# Patient Record
Sex: Male | Born: 1955 | Race: Black or African American | Hispanic: No | Marital: Single | Smoking: Never smoker
Health system: Southern US, Community
[De-identification: ages and names within clinical notes are randomized; demographics above are authoritative.]

## PROBLEM LIST (undated history)

## (undated) DIAGNOSIS — E7800 Pure hypercholesterolemia, unspecified: Secondary | ICD-10-CM

## (undated) DIAGNOSIS — E119 Type 2 diabetes mellitus without complications: Secondary | ICD-10-CM

## (undated) DIAGNOSIS — M1990 Unspecified osteoarthritis, unspecified site: Secondary | ICD-10-CM

## (undated) DIAGNOSIS — I10 Essential (primary) hypertension: Secondary | ICD-10-CM

## (undated) DIAGNOSIS — I4891 Unspecified atrial fibrillation: Secondary | ICD-10-CM

## (undated) DIAGNOSIS — Z87442 Personal history of urinary calculi: Secondary | ICD-10-CM

---

## 1991-06-13 DIAGNOSIS — I4891 Unspecified atrial fibrillation: Secondary | ICD-10-CM

## 1991-06-13 HISTORY — DX: Unspecified atrial fibrillation: I48.91

## 2006-02-01 ENCOUNTER — Emergency Department: Payer: Self-pay | Admitting: Internal Medicine

## 2006-02-12 ENCOUNTER — Emergency Department: Payer: Self-pay | Admitting: Emergency Medicine

## 2007-03-07 ENCOUNTER — Observation Stay: Payer: Self-pay | Admitting: Internal Medicine

## 2007-03-07 ENCOUNTER — Other Ambulatory Visit: Payer: Self-pay

## 2007-09-24 ENCOUNTER — Inpatient Hospital Stay: Payer: Self-pay | Admitting: *Deleted

## 2007-09-24 ENCOUNTER — Other Ambulatory Visit: Payer: Self-pay

## 2007-09-30 ENCOUNTER — Ambulatory Visit: Payer: Self-pay | Admitting: Internal Medicine

## 2008-02-12 ENCOUNTER — Emergency Department: Payer: Self-pay | Admitting: Emergency Medicine

## 2008-04-19 ENCOUNTER — Emergency Department: Payer: Self-pay | Admitting: Emergency Medicine
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
## 2009-12-19 ENCOUNTER — Emergency Department: Payer: Self-pay | Admitting: Internal Medicine

## 2010-04-23 ENCOUNTER — Observation Stay: Payer: Self-pay | Admitting: Internal Medicine

## 2010-05-04 ENCOUNTER — Ambulatory Visit: Payer: Self-pay | Admitting: Internal Medicine

## 2013-04-21 ENCOUNTER — Emergency Department: Payer: Self-pay | Admitting: Emergency Medicine

## 2013-06-26 ENCOUNTER — Emergency Department: Payer: Self-pay | Admitting: Emergency Medicine

## 2014-10-21 ENCOUNTER — Ambulatory Visit
Admission: RE | Admit: 2014-10-21 | Discharge: 2014-10-21 | Disposition: A | Discharge status: Home / Self Care | Payer: No Typology Code available for payment source | Source: Ambulatory Visit | Attending: Nurse Practitioner | Admitting: Nurse Practitioner

## 2014-10-21 ENCOUNTER — Other Ambulatory Visit: Payer: Self-pay | Admitting: Nurse Practitioner

## 2014-10-21 DIAGNOSIS — M25851 Other specified joint disorders, right hip: Secondary | ICD-10-CM | POA: Insufficient documentation

## 2014-10-21 DIAGNOSIS — M160 Bilateral primary osteoarthritis of hip: Secondary | ICD-10-CM | POA: Diagnosis not present

## 2014-10-21 DIAGNOSIS — M545 Low back pain: Secondary | ICD-10-CM | POA: Diagnosis present

## 2014-10-21 DIAGNOSIS — M5387 Other specified dorsopathies, lumbosacral region: Secondary | ICD-10-CM | POA: Insufficient documentation

## 2014-10-21 DIAGNOSIS — M25551 Pain in right hip: Secondary | ICD-10-CM | POA: Diagnosis not present

## 2014-10-21 DIAGNOSIS — M47897 Other spondylosis, lumbosacral region: Secondary | ICD-10-CM | POA: Insufficient documentation

## 2014-10-21 DIAGNOSIS — M25852 Other specified joint disorders, left hip: Secondary | ICD-10-CM | POA: Diagnosis not present

## 2014-11-25 ENCOUNTER — Other Ambulatory Visit: Payer: Self-pay | Admitting: Internal Medicine

## 2014-11-25 DIAGNOSIS — R0602 Shortness of breath: Secondary | ICD-10-CM

## 2014-11-25 DIAGNOSIS — R079 Chest pain, unspecified: Secondary | ICD-10-CM

## 2014-11-27 ENCOUNTER — Encounter: Admission: RE | Admit: 2014-11-27 | Payer: No Typology Code available for payment source | Source: Ambulatory Visit

## 2015-04-09 ENCOUNTER — Emergency Department: Payer: BLUE CROSS/BLUE SHIELD

## 2015-04-09 ENCOUNTER — Emergency Department
Admission: EM | Admit: 2015-04-09 | Discharge: 2015-04-09 | Disposition: A | Discharge status: Home / Self Care | Payer: BLUE CROSS/BLUE SHIELD | Attending: Emergency Medicine | Admitting: Emergency Medicine

## 2015-04-09 DIAGNOSIS — G510 Bell's palsy: Secondary | ICD-10-CM

## 2015-04-09 DIAGNOSIS — E119 Type 2 diabetes mellitus without complications: Secondary | ICD-10-CM | POA: Insufficient documentation

## 2015-04-09 DIAGNOSIS — R29810 Facial weakness: Secondary | ICD-10-CM | POA: Diagnosis present

## 2015-04-09 LAB — COMPREHENSIVE METABOLIC PANEL
ALBUMIN: 4.3 g/dL (ref 3.5–5.0)
ALK PHOS: 66 U/L (ref 38–126)
ALT: 22 U/L (ref 17–63)
ANION GAP: 6 (ref 5–15)
AST: 21 U/L (ref 15–41)
BUN: 14 mg/dL (ref 6–20)
CALCIUM: 9.4 mg/dL (ref 8.9–10.3)
CHLORIDE: 105 mmol/L (ref 101–111)
CO2: 24 mmol/L (ref 22–32)
Creatinine, Ser: 0.74 mg/dL (ref 0.61–1.24)
GFR calc non Af Amer: >60 mL/min (ref >60)
GLUCOSE: 161 mg/dL — AB (ref 65–99)
Potassium: 3.8 mmol/L (ref 3.5–5.1)
SODIUM: 135 mmol/L (ref 135–145)
Total Bilirubin: 0.9 mg/dL (ref 0.3–1.2)
Total Protein: 8.3 g/dL — ABNORMAL HIGH (ref 6.5–8.1)

## 2015-04-09 LAB — DIFFERENTIAL
BASOS PCT: 1 %
Basophils Absolute: 0 10*3/uL (ref 0–0.1)
Eosinophils Absolute: 0.1 10*3/uL (ref 0–0.7)
Eosinophils Relative: 1 %
LYMPHS PCT: 29 %
Lymphs Abs: 1.7 10*3/uL (ref 1.0–3.6)
Monocytes Absolute: 0.4 10*3/uL (ref 0.2–1.0)
Monocytes Relative: 7 %
NEUTROS ABS: 3.6 10*3/uL (ref 1.4–6.5)
NEUTROS PCT: 62 %

## 2015-04-09 LAB — CBC
HCT: 43.5 % (ref 40.0–52.0)
Hemoglobin: 14.5 g/dL (ref 13.0–18.0)
MCH: 27.7 pg (ref 26.0–34.0)
MCHC: 33.4 g/dL (ref 32.0–36.0)
MCV: 83 fL (ref 80.0–100.0)
PLATELETS: 260 10*3/uL (ref 150–440)
RBC: 5.24 MIL/uL (ref 4.40–5.90)
RDW: 14.2 % (ref 11.5–14.5)
WBC: 5.8 10*3/uL (ref 3.8–10.6)

## 2015-04-09 LAB — GLUCOSE, CAPILLARY: Glucose-Capillary: 134 mg/dL — ABNORMAL HIGH (ref 65–99)

## 2015-04-09 LAB — PROTIME-INR
INR: 0.99
PROTHROMBIN TIME: 13.3 s (ref 11.4–15.0)

## 2015-04-09 LAB — APTT: aPTT: 29 seconds (ref 24–36)

## 2015-04-09 MED ORDER — PREDNISONE 20 MG PO TABS
60.0000 mg | ORAL_TABLET | Freq: Once | ORAL | Status: AC
  Administered 2015-04-09: 60 mg via ORAL
  Filled 2015-04-09: qty 3

## 2015-04-09 MED ORDER — FLUORESCEIN SODIUM 1 MG OP STRP
ORAL_STRIP | OPHTHALMIC | Status: AC
  Administered 2015-04-09: 13:00:00
  Filled 2015-04-09: qty 1

## 2015-04-09 MED ORDER — ASPIRIN 81 MG PO CHEW
324.0000 mg | CHEWABLE_TABLET | Freq: Once | ORAL | Status: AC
  Administered 2015-04-09: 324 mg via ORAL
  Filled 2015-04-09: qty 4

## 2015-04-09 MED ORDER — LORAZEPAM 2 MG/ML IJ SOLN
INTRAMUSCULAR | Status: AC
  Filled 2015-04-09: qty 2

## 2015-04-09 MED ORDER — GADOBENATE DIMEGLUMINE 529 MG/ML IV SOLN
20.0000 mL | Freq: Once | INTRAVENOUS | Status: AC | PRN
  Administered 2015-04-09: 20 mL via INTRAVENOUS

## 2015-04-09 NOTE — Discharge Instructions (Signed)
Bell Palsy Bell palsy is a condition in which the muscles on one side of the face become paralyzed. This often causes one side of the face to droop. It is a common condition and most people recover completely. RISK FACTORS Risk factors for Bell palsy include:  Pregnancy.  Diabetes.  An infection by a virus, such as infections that cause cold sores. CAUSES  Bell palsy is caused by damage to or inflammation of a nerve in your face. It is unclear why this happens, but an infection by a virus may lead to it. Most of the time the reason it happens is unknown. SIGNS AND SYMPTOMS  Symptoms can range from mild to severe and can take place over a number of hours. Symptoms may include:  Being unable to:  Raise one or both eyebrows.  Close one or both eyes.  Feel parts of your face (facial numbness).  Drooping of the eyelid and corner of the mouth.  Weakness in the face.  Paralysis of half your face.  Loss of taste.  Sensitivity to loud noises.  Difficulty chewing.  Tearing up of the affected eye.  Dryness in the affected eye.  Drooling.  Pain behind one ear. DIAGNOSIS  Diagnosis of Bell palsy may include:  A medical history and physical exam.  An MRI.  A CT scan.  Electromyography (EMG). This is a test that checks how your nerves are working. TREATMENT  Treatment may include antiviral medicine to help shorten the length of the condition. Sometimes treatment is not needed and the symptoms go away on their own. HOME CARE INSTRUCTIONS   Take medicines only as directed by your health care provider.  Do facial massages and exercises as directed by your health care provider.  If your eye is affected:  Use moisturizing eye drops to prevent drying of your eye as directed by your health care provider.  Protect your eye as directed by your health care provider. SEEK MEDICAL CARE IF:  Your symptoms do not get better or get worse.  You are drooling.  Your eye is red,  irritated, or hurts. SEEK IMMEDIATE MEDICAL CARE IF:   Another part of your body feels weak or numb.  You have difficulty swallowing.  You have a fever along with symptoms of Bell palsy.  You develop neck pain. MAKE SURE YOU:   Understand these instructions.  Will watch your condition.  Will get help right away if you are not doing well or get worse.   This information is not intended to replace advice given to you by your health care provider. Make sure you discuss any questions you have with your health care provider.   Document Released: 05/29/2005 Document Revised: 02/17/2015 Document Reviewed: 09/05/2013 Elsevier Interactive Patient Education [employer].  Please take one aspirin once a day. Start tomorrow afternoon. Please take the prednisone as directed. Take the Valtrex one pill 3 times a day. Please use the eyedrops every 2-4 hours during the day and asked the pharmacist for help getting some mineral based eyedrops to put in the eye at night. He can also take your eyelid shut as I demonstrated to you using paper tape at night. Please follow-up with Dr. Dorita Sciara you have an appointment at 10:30 Monday at [facility] eye center in [facility]. Also please see her doctor on Monday. Remember to return at once if you have any other arm leg or trunk numbness or weakness. Also return if she gets clumsy on one side or the  other. The droopy face may get worse but should not get much worse. If you have a lot of eye pain also return for that. Please also call her neurologist and seemed to get a follow-up appointment with him telling me where in the emergency room and was unable to tolerate the MRI.

## 2015-04-09 NOTE — ED Provider Notes (Signed)
[facility] Emergency Department Provider Note  ____________________________________________  Time seen: Approximately 2:14 PM  I have reviewed the triage vital signs and the nursing notes.   HISTORY  Chief Complaint Facial Droop    HPI Joshua Soulier Googe is a 59 y.o. male patient reports he noted yesterday that his left eye began burning. On waking up this morning he had numbness in the left side of his face and left facial droop. Eyes any symptoms of numbness or weakness anywhere else in his body is not uncoordinated he did not tell me but he apparently told Soc on-call at his speech was a little bit slurry. His speech seems normal when I talk to him however. Nothing seems to make the numbness and weakness better or worse he had no new medicines or activities and nothing else is associated with it. Patient's   No past medical history on file. Patient's past medical history includes diabetes and high cholesterol There are no active problems to display for this patient.   No past surgical history on file.  No current outpatient prescriptions on file.  Allergies Review of patient's allergies indicates no known allergies.  No family history on file.  Social History Social History  Substance Use Topics  . Smoking status: Not on file  . Smokeless tobacco: Not on file  . Alcohol Use: Not on file    Review of Systems Constitutional: No fever/chills Eyes: No visual changes. ENT: No sore throat. Cardiovascular: Denies chest pain. Respiratory: Denies shortness of breath. Gastrointestinal: No abdominal pain.  No nausea, no vomiting.  No diarrhea.  No constipation. Genitourinary: Negative for dysuria. Musculoskeletal: Negative for back pain. Skin: Negative for rash. Neurological: Negative for headaches, focal weakness or numbness except for left side of the face is noted.  10-point ROS otherwise  negative.  ____________________________________________   PHYSICAL EXAM:  VITAL SIGNS: ED Triage Vitals  Enc Vitals Group     BP 04/09/15 1230 126/77 mmHg     Pulse Rate 04/09/15 1230 97     Resp 04/09/15 1230 29     Temp 04/09/15 1300 98.2 F (36.8 C)     Temp src --      SpO2 04/09/15 1230 94 %     Weight --      Height --      Head Cir --      Peak Flow --      Pain Score --      Pain Loc --      Pain Edu? --      Excl. in [place]? --     Constitutional: Alert and oriented. Well appearing and in no acute distress. Eyes: Conjunctivae are normal. PERRL. EOMI. Head: Atraumatic. Nose: No congestion/rhinnorhea. Mouth/Throat: Mucous membranes are moist.  Oropharynx non-erythematous. Neck: No stridor.  Cardiovascular: Normal rate, regular rhythm. Grossly normal heart sounds.  Good peripheral circulation. Respiratory: Normal respiratory effort.  No retractions. Lungs CTAB. Gastrointestinal: Soft and nontender. No distention. No abdominal bruits. No CVA tenderness. Musculoskeletal: No lower extremity tenderness nor edema.  No joint effusions. Neurologic:  Normal speech and language. Cranial nerves II through XII are intact with the exception of the left side of the face feels different when I touch it compared to the right side of the face and the left side of face is somewhat weaker over the eye is not blinking as frequently or closing his tightly and there seems to be a little bit of deficit in the forehead wrinkling  in the left side of the mouth is also a little bit droopy. The cranial nerves are intact cerebellar finger-nose rapid alternating movements in the hands are normal motor strength is 5 over 5 throughout sensation is intact throughout the rest of the body. No gait instability. Skin:  Skin is warm, dry and intact. No rash noted. Psychiatric: Mood and affect are normal. Speech and behavior are normal.  ____________________________________________   LABS (all labs ordered  are listed, but only abnormal results are displayed)  Labs Reviewed  COMPREHENSIVE METABOLIC PANEL - Abnormal; Notable for the following:    Glucose, Bld 161 (*)    Total Protein 8.3 (*)    All other components within normal limits  GLUCOSE, CAPILLARY - Abnormal; Notable for the following:    Glucose-Capillary 134 (*)    All other components within normal limits  PROTIME-INR  APTT  CBC  DIFFERENTIAL  B. BURGDORFI ANTIBODIES  LYME DISEASE DNA BY PCR(BORRELIA BURG)  ANTINUCLEAR ANTIBODIES, IFA  VITAMIN B12  HOMOCYSTEINE  I-STAT TROPOININ, ED  CBG MONITORING, ED  I-STAT CHEM 8, ED   ____________________________________________  EKG  EKG is read and interpreted by me shows normal sinus rhythm at a rate of 100 normal axis essentially normal EKG ____________________________________________  RADIOLOGY  CT the head is read by radiology as normal   PROCEDURES As the patient had burning in the left I did fluorescein fluoroscopy seen left eye is normal [place] was consulted as this is not sure that this is Bell's palsy as I was not sure that this is Bell's palsy either she recommended in the MRI be done and this is been ordered she wanted MRI of the brain with and without contrast with thin cuts through the auditory canal  ____________________________________________   INITIAL IMPRESSION / ASSESSMENT AND PLAN / ED COURSE  Pertinent labs & imaging results that were available during my care of the patient were reviewed by me and considered in my medical decision making (see chart for details).  Patient is totally unable to tolerate the MRI refuses Ativan refuses to try again. He understands the risk that he may be having a stroke even though I think it's most likely Bell's palsy and the neurologist after speaking with her also lead me to believe she thinks is most likely Bell's palsy but was unsure either. Patient's facial droop is slightly worse than it was earlier but there are no  other new findings on reexamination Patient will take the prednisone and is aware that he will need to watch his blood Sugar carefully also takes Valtrex and the aspirin once a day follow-up with his doctor and the eye doctor on Monday.  ____________________________________________   FINAL CLINICAL IMPRESSION(S) / ED DIAGNOSES  Final diagnoses:  Bell palsy   actual diagnosis is left sided facial droop apparently Bell's palsy but not sure.    Nena Polio, MD 04/09/15 [phone]

## 2015-04-10 LAB — VITAMIN B12: VITAMIN B 12: 254 pg/mL (ref 180–914)

## 2015-04-12 LAB — ANTINUCLEAR ANTIBODIES, IFA: ANA Ab, IFA: NEGATIVE

## 2015-04-12 LAB — HOMOCYSTEINE: Homocysteine: 8.5 umol/L (ref 0.0–15.0)

## 2015-04-12 LAB — B. BURGDORFI ANTIBODIES: B burgdorferi Ab IgG+IgM: <0.91 {ISR} (ref 0.00–0.90)

## 2015-04-22 DIAGNOSIS — G510 Bell's palsy: Secondary | ICD-10-CM | POA: Insufficient documentation

## 2015-07-27 ENCOUNTER — Telehealth: Payer: Self-pay | Admitting: Gastroenterology

## 2015-07-27 NOTE — Telephone Encounter (Signed)
colonoscopy

## 2015-08-02 ENCOUNTER — Other Ambulatory Visit: Payer: Self-pay

## 2015-08-02 ENCOUNTER — Telehealth: Payer: Self-pay

## 2015-08-02 NOTE — Telephone Encounter (Signed)
Pt scheduled for a screening colonoscopy at [facility] on 09/03/15. Instructs/rx mailed. Please precert insurance.

## 2015-08-02 NOTE — Telephone Encounter (Signed)
Gastroenterology Pre-Procedure Review  Request Date: 09/03/15 Requesting Physician: Dr. Latricia Heft  PATIENT REVIEW QUESTIONS: The patient responded to the following health history questions as indicated:    1. Are you having any GI issues? no 2. Do you have a personal history of Polyps? no 3. Do you have a family history of Colon Cancer or Polyps? no 4. Diabetes Mellitus? yes (Type 2) 5. Joint replacements in the past 12 months?no 6. Major health problems in the past 3 months?no 7. Any artificial heart valves, MVP, or defibrillator?no    MEDICATIONS & ALLERGIES:    Patient reports the following regarding taking any anticoagulation/antiplatelet therapy:   Plavix, Coumadin, Eliquis, Xarelto, Lovenox, Pradaxa, Brilinta, or Effient? no Aspirin? no  Patient confirms/reports the following medications:  Current Outpatient Prescriptions  Medication Sig Dispense Refill  . lisinopril (PRINIVIL,ZESTRIL) 10 MG tablet Take by mouth.     No current facility-administered medications for this visit.    Patient confirms/reports the following allergies:  No Known Allergies  No orders of the defined types were placed in this encounter.    AUTHORIZATION INFORMATION Primary Insurance: 1D#: Group #:  Secondary Insurance: 1D#: Group #:  SCHEDULE INFORMATION: Date: 09/03/15 Time: Location: [place]

## 2015-08-25 ENCOUNTER — Encounter: Payer: Self-pay | Admitting: *Deleted

## 2015-08-30 ENCOUNTER — Encounter: Payer: Self-pay | Admitting: Anesthesiology

## 2015-09-02 NOTE — Discharge Instructions (Signed)

## 2015-09-03 ENCOUNTER — Ambulatory Visit
Admission: RE | Admit: 2015-09-03 | Payer: BLUE CROSS/BLUE SHIELD | Source: Ambulatory Visit | Admitting: Gastroenterology

## 2015-09-03 HISTORY — DX: Pure hypercholesterolemia, unspecified: E78.00

## 2015-09-03 HISTORY — DX: Type 2 diabetes mellitus without complications: E11.9

## 2015-09-03 HISTORY — DX: Essential (primary) hypertension: I10

## 2015-09-03 HISTORY — DX: Unspecified atrial fibrillation: I48.91

## 2015-09-03 SURGERY — COLONOSCOPY WITH PROPOFOL
Anesthesia: Choice

## 2017-02-10 ENCOUNTER — Emergency Department
Admission: EM | Admit: 2017-02-10 | Discharge: 2017-02-10 | Disposition: A | Discharge status: Home / Self Care | Payer: BLUE CROSS/BLUE SHIELD | Attending: Student in an Organized Health Care Education/Training Program | Admitting: Student in an Organized Health Care Education/Training Program

## 2017-02-10 ENCOUNTER — Encounter: Payer: Self-pay | Admitting: Emergency Medicine

## 2017-02-10 ENCOUNTER — Emergency Department: Payer: BLUE CROSS/BLUE SHIELD

## 2017-02-10 DIAGNOSIS — M79672 Pain in left foot: Secondary | ICD-10-CM | POA: Diagnosis present

## 2017-02-10 DIAGNOSIS — L608 Other nail disorders: Secondary | ICD-10-CM | POA: Insufficient documentation

## 2017-02-10 DIAGNOSIS — E119 Type 2 diabetes mellitus without complications: Secondary | ICD-10-CM | POA: Diagnosis not present

## 2017-02-10 DIAGNOSIS — B353 Tinea pedis: Secondary | ICD-10-CM

## 2017-02-10 DIAGNOSIS — Z79899 Other long term (current) drug therapy: Secondary | ICD-10-CM | POA: Insufficient documentation

## 2017-02-10 DIAGNOSIS — I10 Essential (primary) hypertension: Secondary | ICD-10-CM | POA: Insufficient documentation

## 2017-02-10 DIAGNOSIS — Z794 Long term (current) use of insulin: Secondary | ICD-10-CM | POA: Diagnosis not present

## 2017-02-10 MED ORDER — TERBINAFINE HCL 250 MG PO TABS: 250.0000 mg | ORAL_TABLET | Freq: Every day | ORAL | 0 refills | Status: AC

## 2017-02-10 NOTE — ED Triage Notes (Signed)
Pt to ed with c/o ?infection in left foot.  Pt states finished abx last week and he noticed redness and blisters to left foot last night.

## 2017-02-10 NOTE — ED Provider Notes (Signed)
[facility] Emergency Department Provider Note   ____________________________________________   I have reviewed the triage vital signs and the nursing notes.   HISTORY  Chief Complaint Foot Pain    HPI Brett Wiley is a 61 y.o. male presents to the emergency department with left foot pain secondary to erythema, swelling, weeping that developed after completing a course of antibiotics for a foot infection 2 weeks. There is also a fungal/yeast odor present. Symptom localized over all 5 toes and distal forefoot. Patient is an insulin-dependent diabetic, well-controlled. 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  Past Medical History:  Diagnosis Date  . Atrial fibrillation ([place]) 1993  . Diabetes mellitus without complication ([place])   . Hypercholesteremia   . Hypertension     Patient Active Problem List   Diagnosis Date Noted  . Bell palsy 04/22/2015    Past Surgical History:  Procedure Laterality Date  . NO PAST SURGERIES      Prior to Admission medications   Medication Sig Start Date End Date Taking? Authorizing Provider  diltiazem (CARDIZEM CD) 240 MG 24 hr capsule Take 240 mg by mouth daily.    [provider]  Insulin Glargine (TOUJEO SOLOSTAR [place]) Inject into the skin. Inject 20units [place] QD for diabetes    [provider]  Liraglutide 18 MG/3ML SOPN Inject into the skin.    [provider]  lisinopril (PRINIVIL,ZESTRIL) 10 MG tablet Take by mouth.    [provider]  lovastatin (MEVACOR) 20 MG tablet Take 20 mg by mouth at bedtime.    [provider]  terbinafine (LAMISIL) 250 MG tablet Take 1 tablet (250 mg total) by mouth daily. 02/10/17 02/24/17  Nan Maya, Laroy Apple, PA-C     Allergies Patient has no known allergies.  History reviewed. No pertinent family history.  Social History Social History  Substance Use Topics  . Smoking status: Never Smoker  . Smokeless tobacco: Never Used  . Alcohol use No    Review of Systems Constitutional: Negative for fever/chills Eyes: No visual changes. ENT:  Negative for sore throat and for difficulty swallowing Cardiovascular: Denies chest pain. Respiratory: Denies cough. Denies shortness of breath. Gastrointestinal: No abdominal pain.  No nausea, vomiting, diarrhea. Genitourinary: Negative for dysuria. Musculoskeletal: Positive for left foot pain Skin: Negative for rash. Left foot erythema, swelling and weeping. Neurological: Negative for headaches. Able to ambulate. ____________________________________________   PHYSICAL EXAM:  VITAL SIGNS: ED Triage Vitals  Enc Vitals Group     BP 02/10/17 1458 128/71     Pulse Rate 02/10/17 1458 80     Resp 02/10/17 1458 18     Temp 02/10/17 1458 98 F (36.7 C)     Temp Source 02/10/17 1458 Oral     SpO2 02/10/17 1458 95 %     Weight 02/10/17 1458 270 lb (122.5 kg)     Height --      Head Circumference --      Peak Flow --      Pain Score 02/10/17 1457 8     Pain Loc --      Pain Edu? --      Excl. in [place]? --     Constitutional: Alert and oriented. Well appearing and in no acute distress.  Eyes: Conjunctivae  are normal. PERRL. EOMI  Head: Normocephalic and atraumatic. ENT:      Ears: Canals clear. TMs intact bilaterally.      Nose: No congestion/rhinnorhea.      Mouth/Throat: Mucous membranes are moist. Neck:Supple. No thyromegaly. No stridor.  Cardiovascular: Normal rate, regular rhythm. Normal S1 and S2.  Good peripheral circulation. Respiratory: Normal respiratory effort without tachypnea or retractions. Lungs CTAB. No wheezes/rales/rhonchi. Good air entry to the bases with no decreased or absent breath sounds. Hematological/Lymphatic/Immunological: No  cervical lymphadenopathy. Cardiovascular: Normal rate, regular rhythm. Normal distal pulses. Gastrointestinal: Bowel sounds 4 quadrants.  Musculoskeletal: Left foot pain and swelling. Fungal infection along toes and distal forefoot.  Neurologic: Normal speech and language.  Skin:  Skin is warm, dry and intact. No rash noted. Left foot erythematous erosions, swelling and weeping. Flaking and peeling skin along toes. Yeast/fungal odor.  Psychiatric: Mood and affect are normal. Speech and behavior are normal. Patient exhibits appropriate insight and judgement.  ____________________________________________   LABS (all labs ordered are listed, but only abnormal results are displayed)  Labs Reviewed - No data to display ____________________________________________  EKG none ____________________________________________  RADIOLOGY DG Foot complete FINDINGS: No radiographic evidence of osteomyelitis noted. No acute fracture, subluxation or dislocation identified. No focal bony lesions are noted. The joint spaces are unremarkable.  IMPRESSION: No acute or significant bony abnormalities. ____________________________________________   PROCEDURES  Procedure(s) performed: no    Critical Care performed: no ____________________________________________   INITIAL IMPRESSION / ASSESSMENT AND PLAN / ED COURSE  Pertinent labs & imaging results that were available during my care of the patient were reviewed by me and considered in my medical decision making (see chart for details).   Patient presents to emergency department with left foot pain secondary to erythema, swelling and weeping that developed after completing a course of antibiotics for a foot infection 2 weeks ago. History, physical exam findings and imaging are reassuring symptoms are consistent with tinea pedis along left toes and forefoot. Patient will be prescribed course of  terbinafine.  Patient advised to follow up with  podiatry as soon as he can be scheduled or return to the emergency department if symptoms return or worsen. Patient informed of clinical course, understand medical decision-making process, and agree with plan. ____________________________________________   FINAL CLINICAL IMPRESSION(S) / ED DIAGNOSES  Final diagnoses:  Tinea pedis of left foot  Onychomadesis of toenail       NEW MEDICATIONS STARTED DURING THIS VISIT:  Discharge Medication List as of 02/10/2017  4:33 PM    START taking these medications   Details  terbinafine (LAMISIL) 250 MG tablet Take 1 tablet (250 mg total) by mouth daily., Starting Sat 02/10/2017, Until Sat 02/24/2017, Print         Note:  This document was prepared using Dragon voice recognition software and may include unintentional dictation errors.    Valicia Rief, Fleet Contras 02/10/17 2227    Merlyn Lot, MD 02/10/17 2337

## 2017-02-10 NOTE — ED Notes (Signed)
FIRST NURSE NOTE:  Pt reports diabetic foot wound that is starting blister. Pt states blood sugars at home are in the 200s. Pt ambulatory in lobby, placed in wheelchair.

## 2017-02-10 NOTE — Discharge Instructions (Signed)
Take medication as prescribed. Return to emergency department if symptoms worsen and follow-up with PCP as needed.    Keep feet clean and dry.   Follow up with podiatry.

## 2017-02-11 DIAGNOSIS — L089 Local infection of the skin and subcutaneous tissue, unspecified: Secondary | ICD-10-CM

## 2017-02-11 DIAGNOSIS — E11628 Type 2 diabetes mellitus with other skin complications: Secondary | ICD-10-CM | POA: Insufficient documentation

## 2017-02-11 DIAGNOSIS — E11649 Type 2 diabetes mellitus with hypoglycemia without coma: Secondary | ICD-10-CM | POA: Insufficient documentation

## 2017-02-11 DIAGNOSIS — I10 Essential (primary) hypertension: Secondary | ICD-10-CM | POA: Insufficient documentation

## 2017-02-11 DIAGNOSIS — E1159 Type 2 diabetes mellitus with other circulatory complications: Secondary | ICD-10-CM | POA: Insufficient documentation

## 2017-02-11 DIAGNOSIS — Z794 Long term (current) use of insulin: Secondary | ICD-10-CM | POA: Insufficient documentation

## 2017-02-11 DIAGNOSIS — E1165 Type 2 diabetes mellitus with hyperglycemia: Secondary | ICD-10-CM | POA: Insufficient documentation

## 2017-02-12 DIAGNOSIS — R600 Localized edema: Secondary | ICD-10-CM | POA: Insufficient documentation

## 2017-02-12 DIAGNOSIS — I480 Paroxysmal atrial fibrillation: Secondary | ICD-10-CM | POA: Insufficient documentation

## 2017-02-26 ENCOUNTER — Encounter: Payer: Self-pay | Admitting: Gastroenterology

## 2017-02-26 ENCOUNTER — Ambulatory Visit: Payer: BLUE CROSS/BLUE SHIELD | Admitting: Gastroenterology

## 2017-03-30 DIAGNOSIS — Z23 Encounter for immunization: Secondary | ICD-10-CM | POA: Diagnosis not present

## 2017-03-30 DIAGNOSIS — E782 Mixed hyperlipidemia: Secondary | ICD-10-CM | POA: Diagnosis not present

## 2017-03-30 DIAGNOSIS — E11621 Type 2 diabetes mellitus with foot ulcer: Secondary | ICD-10-CM | POA: Diagnosis not present

## 2017-03-30 DIAGNOSIS — I10 Essential (primary) hypertension: Secondary | ICD-10-CM | POA: Diagnosis not present

## 2017-05-11 DIAGNOSIS — I10 Essential (primary) hypertension: Secondary | ICD-10-CM | POA: Diagnosis not present

## 2017-05-11 DIAGNOSIS — R0602 Shortness of breath: Secondary | ICD-10-CM | POA: Diagnosis not present

## 2017-05-11 DIAGNOSIS — E11621 Type 2 diabetes mellitus with foot ulcer: Secondary | ICD-10-CM | POA: Diagnosis not present

## 2017-05-28 ENCOUNTER — Ambulatory Visit: Payer: Self-pay

## 2017-07-02 ENCOUNTER — Other Ambulatory Visit: Payer: 59

## 2017-07-02 DIAGNOSIS — R0602 Shortness of breath: Secondary | ICD-10-CM | POA: Diagnosis not present

## 2017-07-02 DIAGNOSIS — R0600 Dyspnea, unspecified: Secondary | ICD-10-CM

## 2017-07-19 ENCOUNTER — Other Ambulatory Visit: Payer: Self-pay

## 2017-07-19 MED ORDER — BASAGLAR KWIKPEN 100 UNIT/ML ~~LOC~~ SOPN: 60.0000 [IU] | PEN_INJECTOR | Freq: Every day | SUBCUTANEOUS | 3 refills | Status: DC

## 2017-08-10 ENCOUNTER — Other Ambulatory Visit: Payer: Self-pay

## 2017-08-10 MED ORDER — DILTIAZEM HCL ER COATED BEADS 240 MG PO CP24: 240.0000 mg | ORAL_CAPSULE | Freq: Every day | ORAL | 5 refills | Status: DC

## 2017-08-13 ENCOUNTER — Encounter: Payer: Self-pay | Admitting: Nurse Practitioner

## 2017-08-13 ENCOUNTER — Ambulatory Visit: Payer: 59 | Admitting: Nurse Practitioner

## 2017-08-13 VITALS — BP 145/95 | HR 75 | Resp 16 | Ht 71.0 in | Wt 315.0 lb

## 2017-08-13 DIAGNOSIS — I10 Essential (primary) hypertension: Secondary | ICD-10-CM

## 2017-08-13 DIAGNOSIS — E291 Testicular hypofunction: Secondary | ICD-10-CM | POA: Diagnosis not present

## 2017-08-13 DIAGNOSIS — E1165 Type 2 diabetes mellitus with hyperglycemia: Secondary | ICD-10-CM | POA: Diagnosis not present

## 2017-08-13 DIAGNOSIS — R0602 Shortness of breath: Secondary | ICD-10-CM

## 2017-08-13 LAB — POCT GLYCOSYLATED HEMOGLOBIN (HGB A1C): Hemoglobin A1C: 8.5

## 2017-08-13 MED ORDER — SILDENAFIL CITRATE 20 MG PO TABS: 20.0000 mg | ORAL_TABLET | Freq: Every day | ORAL | 3 refills | Status: DC | PRN

## 2017-08-13 NOTE — Progress Notes (Signed)
[facility] [place], [place] [address]  Internal MEDICINE  Office Visit Note  Patient Name: Brett Wiley  [id number]  [id number]  Date of Service: 08/29/2017  Chief Complaint  Patient presents with  . Diabetes    now on 60 units of basaglar. taking sliding scale insulin as needed     Diabetes  He presents for his follow-up diabetic visit. He has type 2 diabetes mellitus. His disease course has been improving. There are no hypoglycemic associated symptoms. Pertinent negatives for hypoglycemia include no headaches or nervousness/anxiousness. Associated symptoms include foot ulcerations. Pertinent negatives for diabetes include no chest pain, no fatigue and no weakness. There are no hypoglycemic complications. Symptoms are improving. Diabetic complications include impotence. Risk factors for coronary artery disease include diabetes mellitus, dyslipidemia, hypertension and obesity. Current diabetic treatment includes insulin injections. He is compliant with treatment most of the time. He is following a low fat/cholesterol diet. Meal planning includes avoidance of concentrated sweets. He has not had a previous visit with a dietitian. He participates in exercise three times a week. His home blood glucose trend is decreasing steadily. An ACE inhibitor/angiotensin II receptor blocker is being taken. He sees a podiatrist.Eye exam is not current.    Pt is here for routine follow up.    Current Medication: Outpatient Encounter Medications as of 08/13/2017  Medication Sig Note  . diltiazem (CARDIZEM CD) 240 MG 24 hr capsule Take 1 capsule (240 mg total) by mouth daily.   Marland Kitchen HUMALOG KWIKPEN 100 UNIT/ML KiwkPen 10 to 20 units with meal per sliding scale   . Insulin Glargine (BASAGLAR KWIKPEN) 100 UNIT/ML SOPN Inject 0.6 mLs (60 Units total) into the skin daily. Inject up to  60 units every evening   . lisinopril (PRINIVIL,ZESTRIL) 2.5 MG tablet    . lovastatin (MEVACOR) 20 MG  tablet Take 20 mg by mouth at bedtime.   [name] DELICA LANCETS 75I MISC    . ONETOUCH VERIO test strip    . [DISCONTINUED] SOLIQUA 100-33 UNT-MCG/ML SOPN    . Insulin Aspart (NOVOLOG FLEXPEN [place]) Inject into the skin.   . sildenafil (REVATIO) 20 MG tablet Take 1 tablet (20 mg total) by mouth daily as needed.   . [DISCONTINUED] Insulin Glargine 300 UNIT/ML SOPN Inject into the skin.   . [DISCONTINUED] Liraglutide 18 MG/3ML SOPN Inject into the skin.   . [DISCONTINUED] lisinopril (PRINIVIL,ZESTRIL) 10 MG tablet Take by mouth. 08/02/2015: Received from: [facility]  . [DISCONTINUED] NIFEdipine (PROCARDIA) 20 MG capsule Take 20 mg by mouth.   . [DISCONTINUED] NIFEdipine (PROCARDIA) 20 MG capsule Take 20 mg by mouth.   . [DISCONTINUED] TOUJEO SOLOSTAR 300 UNIT/ML SOPN     No facility-administered encounter medications on file as of 08/13/2017.     Surgical History: Past Surgical History:  Procedure Laterality Date  . NO PAST SURGERIES      Medical History: Past Medical History:  Diagnosis Date  . Atrial fibrillation ([place]) 1993  . Diabetes mellitus without complication ([place])   . Hypercholesteremia   . Hypertension     Family History: Family History  Problem Relation Age of Onset  . Diabetes Mother   . Diabetes Father   . Hypertension Father     Social History   Socioeconomic History  . Marital status: Single    Spouse name: Not on file  . Number of children: Not on file  . Years of education: Not on file  . Highest education level:  Not on file  Social Needs  . Financial resource strain: Not on file  . Food insecurity - worry: Not on file  . Food insecurity - inability: Not on file  . Transportation needs - medical: Not on file  . Transportation needs - non-medical: Not on file  Occupational History  . Not on file  Tobacco Use  . Smoking status: Never Smoker  . Smokeless tobacco: Never Used  Substance and Sexual Activity  . Alcohol use: No     Alcohol/week: 0.0 oz  . Drug use: No  . Sexual activity: Not on file  Other Topics Concern  . Not on file  Social History Narrative  . Not on file      Review of Systems  Constitutional: Negative for activity change and fatigue.  HENT: Negative for congestion, postnasal drip, sinus pressure and sneezing.   Eyes: Negative.   Respiratory: Positive for shortness of breath. Negative for chest tightness and wheezing.        Improving shortness of breath  Cardiovascular: Positive for palpitations. Negative for chest pain.       Improved since hs last visit.  Gastrointestinal: Negative for constipation, diarrhea, nausea and vomiting.  Endocrine:       Blood sugars improving  Genitourinary: Positive for impotence.  Musculoskeletal: Negative for arthralgias and back pain.  Skin: Negative for rash.  Allergic/Immunologic: Negative for environmental allergies.  Neurological: Negative for weakness and headaches.  Hematological: Negative for adenopathy.  Psychiatric/Behavioral: Negative for behavioral problems. The patient is not nervous/anxious.    Today's Vitals   08/13/17 1624  BP: (!) 145/95  Pulse: 75  Resp: 16  SpO2: 97%  Weight: (!) 315 lb (142.9 kg)  Height: 5\' 11"  (1.803 m)    Physical Exam  Constitutional: He is oriented to person, place, and time. He appears well-developed and well-nourished. No distress.  HENT:  Head: Normocephalic and atraumatic.  Mouth/Throat: Oropharynx is clear and moist. No oropharyngeal exudate.  Eyes: EOM are normal. Pupils are equal, round, and reactive to light.  Neck: Normal range of motion. Neck supple. No JVD present. Carotid bruit is not present. No tracheal deviation present. No thyromegaly present.  Cardiovascular: Normal rate, regular rhythm and normal heart sounds. Exam reveals no gallop and no friction rub.  No murmur heard. Pulmonary/Chest: Effort normal and breath sounds normal. No respiratory distress. He has no wheezes. He has no  rales. He exhibits no tenderness.  Abdominal: Soft. Bowel sounds are normal. There is no tenderness.  Musculoskeletal: Normal range of motion.  Lymphadenopathy:    He has no cervical adenopathy.  Neurological: He is alert and oriented to person, place, and time. No cranial nerve deficit.  Skin: Skin is warm and dry. He is not diaphoretic.  Psychiatric: He has a normal mood and affect. His behavior is normal. Judgment and thought content normal.  Nursing note and vitals reviewed.  Assessment/Plan:  1. Uncontrolled type 2 diabetes mellitus with hyperglycemia (HCC) - POCT HgB A1C 8.5. Continuing to improve. Will continue to titrate glargine insulin to total of 60 units. Use regular insulin for sliding scale as needed and as prescribed. Reviewed ADA diet and importance of regular exercise.   2. Shortness of breath Has resolved since his last visit. Reviewed echocardiogram results with the patient. Overall, results improved since last study. Will monitor closely   3. Essential hypertension Stable. 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 Reviewed titration  instructions.  - sildenafil (REVATIO) 20 MG tablet; Take 1 tablet (20 mg total) by mouth daily as needed.  Dispense: 30 tablet; Refill: 3   General Counseling: Novah verbalizes understanding of the findings of todays visit and agrees with plan of treatment. I have discussed any further diagnostic evaluation that may be needed or ordered today. We also reviewed his medications today. he has been encouraged to call the office with any questions or concerns that should arise related to todays visit.   This patient was seen by Leretha Pol, FNP- C in Collaboration with Dr Lavera Guise as a part of collaborative care agreement    Orders Placed This Encounter  Procedures  . POCT HgB A1C    Meds ordered this encounter  Medications  . sildenafil (REVATIO) 20 MG  tablet    Sig: Take 1 tablet (20 mg total) by mouth daily as needed.    Dispense:  30 tablet    Refill:  3    Order Specific Question:   Supervising Provider    Answer:   Lavera Guise [[id number]]    Time spent: 8 Minutes   Dr Lavera Guise Internal medicine

## 2017-08-29 DIAGNOSIS — E291 Testicular hypofunction: Secondary | ICD-10-CM | POA: Insufficient documentation

## 2017-08-29 DIAGNOSIS — R0602 Shortness of breath: Secondary | ICD-10-CM | POA: Insufficient documentation

## 2017-08-29 DIAGNOSIS — E1165 Type 2 diabetes mellitus with hyperglycemia: Secondary | ICD-10-CM | POA: Insufficient documentation

## 2017-09-10 ENCOUNTER — Other Ambulatory Visit: Payer: Self-pay | Admitting: Nurse Practitioner

## 2017-09-10 MED ORDER — LISINOPRIL 2.5 MG PO TABS: 2.5000 mg | ORAL_TABLET | Freq: Every day | ORAL | 3 refills | Status: DC

## 2017-10-17 ENCOUNTER — Other Ambulatory Visit: Payer: Self-pay | Admitting: Internal Medicine

## 2017-10-17 DIAGNOSIS — R0600 Dyspnea, unspecified: Secondary | ICD-10-CM

## 2017-10-22 ENCOUNTER — Other Ambulatory Visit: Payer: Self-pay

## 2017-10-22 MED ORDER — LOVASTATIN 20 MG PO TABS: 20.0000 mg | ORAL_TABLET | Freq: Every day | ORAL | 1 refills | Status: DC

## 2017-10-29 ENCOUNTER — Other Ambulatory Visit: Payer: Self-pay

## 2017-10-29 MED ORDER — HUMALOG KWIKPEN 100 UNIT/ML ~~LOC~~ SOPN: PEN_INJECTOR | SUBCUTANEOUS | 5 refills | Status: DC

## 2017-11-06 ENCOUNTER — Other Ambulatory Visit: Payer: Self-pay | Admitting: Nurse Practitioner

## 2017-11-06 ENCOUNTER — Other Ambulatory Visit: Payer: Self-pay

## 2017-11-06 MED ORDER — BASAGLAR KWIKPEN 100 UNIT/ML ~~LOC~~ SOPN: 60.0000 [IU] | PEN_INJECTOR | Freq: Every day | SUBCUTANEOUS | 3 refills | Status: DC

## 2017-11-13 ENCOUNTER — Ambulatory Visit: Payer: Self-pay | Admitting: Nurse Practitioner

## 2017-11-27 ENCOUNTER — Encounter: Payer: Self-pay | Admitting: Nurse Practitioner

## 2017-11-27 ENCOUNTER — Ambulatory Visit: Payer: 59 | Admitting: Nurse Practitioner

## 2017-11-27 VITALS — BP 136/83 | HR 83 | Resp 16 | Ht 71.5 in | Wt 312.6 lb

## 2017-11-27 DIAGNOSIS — E11649 Type 2 diabetes mellitus with hypoglycemia without coma: Secondary | ICD-10-CM | POA: Diagnosis not present

## 2017-11-27 DIAGNOSIS — I10 Essential (primary) hypertension: Secondary | ICD-10-CM

## 2017-11-27 DIAGNOSIS — Z1211 Encounter for screening for malignant neoplasm of colon: Secondary | ICD-10-CM | POA: Diagnosis not present

## 2017-11-27 LAB — POCT GLYCOSYLATED HEMOGLOBIN (HGB A1C): Hemoglobin A1C: 9 % — AB (ref 4.0–5.6)

## 2017-11-27 MED ORDER — SEMAGLUTIDE (1 MG/DOSE) 2 MG/1.5ML ~~LOC~~ SOPN: 0.5000 mg | PEN_INJECTOR | SUBCUTANEOUS | 5 refills | Status: DC

## 2017-11-27 NOTE — Progress Notes (Signed)
[facility] [place], [place] [address]  Internal MEDICINE  Office Visit Note  Patient Name: Brett Wiley  [id number]  [id number]  Date of Service: 11/27/2017   Pt is here for routine follow up.   Chief Complaint  Patient presents with  . Diabetes    3 month follow up    Diabetes  He presents for his follow-up diabetic visit. He has type 2 diabetes mellitus. His disease course has been worsening. There are no hypoglycemic associated symptoms. Pertinent negatives for hypoglycemia include no dizziness, headaches or nervousness/anxiousness. There are no diabetic associated symptoms. Pertinent negatives for diabetes include no chest pain, no fatigue and no weakness. There are no hypoglycemic complications. Symptoms are stable. There are no diabetic complications. Risk factors for coronary artery disease include diabetes mellitus, dyslipidemia, hypertension, male sex and obesity. Current diabetic treatment includes insulin injections. He is compliant with treatment most of the time. His weight is decreasing steadily. He is following a generally healthy diet. Meal planning includes avoidance of concentrated sweets. He has not had a previous visit with a dietitian. He participates in exercise three times a week. There is no change in his home blood glucose trend. An ACE inhibitor/angiotensin II receptor blocker is being taken. He sees a podiatrist.Eye exam is current.       Current Medication: Outpatient Encounter Medications as of 11/27/2017  Medication Sig  . diltiazem (CARDIZEM CD) 240 MG 24 hr capsule Take 1 capsule (240 mg total) by mouth daily.  Marland Kitchen HUMALOG KWIKPEN 100 UNIT/ML KiwkPen 10 to 20 units with meal per sliding scale  . Insulin Aspart (NOVOLOG FLEXPEN [place]) Inject into the skin.  . Insulin Glargine (BASAGLAR KWIKPEN) 100 UNIT/ML SOPN Inject 0.6 mLs (60 Units total) into the skin daily. Inject up to  60 units every evening  . lisinopril (PRINIVIL,ZESTRIL)  2.5 MG tablet Take 1 tablet (2.5 mg total) by mouth daily.  Marland Kitchen lovastatin (MEVACOR) 20 MG tablet Take 1 tablet (20 mg total) by mouth at bedtime.  [name] DELICA LANCETS 01U MISC   . ONETOUCH VERIO test strip   . sildenafil (REVATIO) 20 MG tablet Take 1 tablet (20 mg total) by mouth daily as needed.  . Semaglutide (OZEMPIC) 1 MG/DOSE SOPN Inject 0.5 mg into the skin once a week.   No facility-administered encounter medications on file as of 11/27/2017.     Surgical History: Past Surgical History:  Procedure Laterality Date  . NO PAST SURGERIES      Medical History: Past Medical History:  Diagnosis Date  . Atrial fibrillation ([place]) 1993  . Diabetes mellitus without complication ([place])   . Hypercholesteremia   . Hypertension     Family History: Family History  Problem Relation Age of Onset  . Diabetes Mother   . Diabetes Father   . Hypertension Father     Social History   Socioeconomic History  . Marital status: Single    Spouse name: Not on file  . Number of children: Not on file  . Years of education: Not on file  . Highest education level: Not on file  Occupational History  . Not on file  Social Needs  . Financial resource strain: Not on file  . Food insecurity:    Worry: Not on file    Inability: Not on file  . Transportation needs:    Medical: Not on file    Non-medical: Not on file  Tobacco Use  . Smoking status: Never Smoker  .  Smokeless tobacco: Never Used  Substance and Sexual Activity  . Alcohol use: No    Alcohol/week: 0.0 oz  . Drug use: No  . Sexual activity: Not on file  Lifestyle  . Physical activity:    Days per week: Not on file    Minutes per session: Not on file  . Stress: Not on file  Relationships  . Social connections:    Talks on phone: Not on file    Gets together: Not on file    Attends religious service: Not on file    Active member of club or organization: Not on file    Attends meetings of clubs or organizations: Not on  file    Relationship status: Not on file  . Intimate partner violence:    Fear of current or ex partner: Not on file    Emotionally abused: Not on file    Physically abused: Not on file    Forced sexual activity: Not on file  Other Topics Concern  . Not on file  Social History Narrative  . Not on file      Review of Systems  Constitutional: Negative for activity change, fatigue and unexpected weight change.  HENT: Negative for congestion, postnasal drip, sinus pressure and sneezing.   Eyes: Negative.   Respiratory: Negative for chest tightness, shortness of breath and wheezing.   Cardiovascular: Negative for chest pain and palpitations.       Improved since hs last visit.  Gastrointestinal: Negative for constipation, diarrhea, nausea and vomiting.  Endocrine:       Patient taking basaglar 60 units.  Takes sliding scale as needed, prior to meals. Using this 2 to 3 times per day  Musculoskeletal: Negative for arthralgias and back pain.  Skin: Negative for rash.  Allergic/Immunologic: Negative for environmental allergies.  Neurological: Negative for dizziness, weakness and headaches.  Hematological: Negative for adenopathy.  Psychiatric/Behavioral: Negative for behavioral problems. The patient is not nervous/anxious.     Today's Vitals   11/27/17 1152  BP: 136/83  Pulse: 83  Resp: 16  SpO2: 92%  Weight: (!) 312 lb 9.6 oz (141.8 kg)  Height: 5' 11.5" (1.816 m)   Physical Exam  Constitutional: He is oriented to person, place, and time. He appears well-developed and well-nourished. No distress.  HENT:  Head: Normocephalic and atraumatic.  Mouth/Throat: Oropharynx is clear and moist. No oropharyngeal exudate.  Eyes: Pupils are equal, round, and reactive to light. Conjunctivae and EOM are normal.  Neck: Normal range of motion. Neck supple. No JVD present. Carotid bruit is not present. No tracheal deviation present. No thyromegaly present.  Cardiovascular: Normal rate, regular  rhythm and normal heart sounds. Exam reveals no gallop and no friction rub.  No murmur heard. Pulmonary/Chest: Effort normal and breath sounds normal. No respiratory distress. He has no wheezes. He has no rales. He exhibits no tenderness.  Abdominal: Soft. Bowel sounds are normal. There is no tenderness.  Musculoskeletal: Normal range of motion.  Lymphadenopathy:    He has no cervical adenopathy.  Neurological: He is alert and oriented to person, place, and time. No cranial nerve deficit.  Skin: Skin is warm and dry. He is not diaphoretic.  Psychiatric: He has a normal mood and affect. His behavior is normal. Judgment and thought content normal.  Nursing note and vitals reviewed.  Assessment/Plan: 1. Uncontrolled type 2 diabetes mellitus with hypoglycemia, unspecified hypoglycemia coma status (HCC) - POCT HgB A1C 9.0 today. Add ozempic 0.5mg  weekly. Sample provided today. Continue  basaglar and sliding scale insulin as before. Diabetic diet information provided  2. Essential hypertension Stable. Continue bp medication as prescribed.   3. Encounter for screening colonoscopy Refer to GI for screening colonoscopy.  - Ambulatory referral to Gastroenterology  General Counseling: Nyair verbalizes understanding of the findings of todays visit and agrees with plan of treatment. I have discussed any further diagnostic evaluation that may be needed or ordered today. We also reviewed his medications today. he has been encouraged to call the office with any questions or concerns that should arise related to todays visit.    Counseling:  Diabetes Counseling:  1. Addition of ACE inh/ ARB'S for nephroprotection. 2. Diabetic foot care, prevention of complications.  3.Exercise and lose weight.  4. Diabetic eye examination, 5. Monitor blood sugar closlely. nutrition counseling.  6.Sign and symptoms of hypoglycemia including shaking sweating,confusion and headaches.   This patient was seen by  Leretha Pol, FNP- C in Collaboration with Dr Lavera Guise as a part of collaborative care agreement  Orders Placed This Encounter  Procedures  . Ambulatory referral to Gastroenterology  . POCT HgB A1C    Meds ordered this encounter  Medications  . Semaglutide (OZEMPIC) 1 MG/DOSE SOPN    Sig: Inject 0.5 mg into the skin once a week.    Dispense:  2 pen    Refill:  5    Sample and copay card provided today.    Order Specific Question:   Supervising Provider    Answer:   Lavera Guise [[id number]]    Time spent: 10 Minutes      Dr Lavera Guise Internal medicine

## 2017-11-29 ENCOUNTER — Other Ambulatory Visit: Payer: Self-pay

## 2017-11-29 DIAGNOSIS — Z1211 Encounter for screening for malignant neoplasm of colon: Secondary | ICD-10-CM

## 2017-12-18 ENCOUNTER — Encounter: Payer: Self-pay | Admitting: Certified Registered Nurse Anesthetist

## 2017-12-18 ENCOUNTER — Encounter
Admission: RE | Disposition: A | Discharge status: Home / Self Care | Payer: Self-pay | Source: Ambulatory Visit | Attending: Gastroenterology

## 2017-12-18 ENCOUNTER — Ambulatory Visit
Admission: RE | Admit: 2017-12-18 | Discharge: 2017-12-18 | Disposition: A | Discharge status: Home / Self Care | Payer: Commercial Managed Care - HMO | Source: Ambulatory Visit | Attending: Gastroenterology | Admitting: Gastroenterology

## 2017-12-18 ENCOUNTER — Ambulatory Visit: Payer: Commercial Managed Care - HMO | Admitting: Certified Registered Nurse Anesthetist

## 2017-12-18 DIAGNOSIS — I4891 Unspecified atrial fibrillation: Secondary | ICD-10-CM | POA: Insufficient documentation

## 2017-12-18 DIAGNOSIS — I10 Essential (primary) hypertension: Secondary | ICD-10-CM | POA: Insufficient documentation

## 2017-12-18 DIAGNOSIS — E119 Type 2 diabetes mellitus without complications: Secondary | ICD-10-CM | POA: Insufficient documentation

## 2017-12-18 DIAGNOSIS — E7800 Pure hypercholesterolemia, unspecified: Secondary | ICD-10-CM | POA: Insufficient documentation

## 2017-12-18 DIAGNOSIS — Z79899 Other long term (current) drug therapy: Secondary | ICD-10-CM | POA: Diagnosis not present

## 2017-12-18 DIAGNOSIS — Z87442 Personal history of urinary calculi: Secondary | ICD-10-CM | POA: Diagnosis not present

## 2017-12-18 DIAGNOSIS — K5730 Diverticulosis of large intestine without perforation or abscess without bleeding: Secondary | ICD-10-CM | POA: Diagnosis not present

## 2017-12-18 DIAGNOSIS — Z794 Long term (current) use of insulin: Secondary | ICD-10-CM | POA: Diagnosis not present

## 2017-12-18 DIAGNOSIS — Z1211 Encounter for screening for malignant neoplasm of colon: Secondary | ICD-10-CM | POA: Insufficient documentation

## 2017-12-18 DIAGNOSIS — D125 Benign neoplasm of sigmoid colon: Secondary | ICD-10-CM

## 2017-12-18 DIAGNOSIS — K5790 Diverticulosis of intestine, part unspecified, without perforation or abscess without bleeding: Secondary | ICD-10-CM | POA: Diagnosis not present

## 2017-12-18 DIAGNOSIS — K635 Polyp of colon: Secondary | ICD-10-CM | POA: Diagnosis not present

## 2017-12-18 HISTORY — DX: Personal history of urinary calculi: Z87.442

## 2017-12-18 HISTORY — PX: COLONOSCOPY WITH PROPOFOL: SHX5780

## 2017-12-18 LAB — GLUCOSE, CAPILLARY: Glucose-Capillary: 81 mg/dL (ref 70–99)

## 2017-12-18 SURGERY — COLONOSCOPY WITH PROPOFOL
Anesthesia: General

## 2017-12-18 MED ORDER — PROPOFOL 10 MG/ML IV BOLUS
INTRAVENOUS | Status: DC | PRN
  Administered 2017-12-18: 70 mg via INTRAVENOUS
  Administered 2017-12-18: 10 mg via INTRAVENOUS
  Administered 2017-12-18: 20 mg via INTRAVENOUS

## 2017-12-18 MED ORDER — PROPOFOL 500 MG/50ML IV EMUL
INTRAVENOUS | Status: DC | PRN
  Administered 2017-12-18: 185 ug/kg/min via INTRAVENOUS

## 2017-12-18 MED ORDER — SODIUM CHLORIDE 0.9 % IV SOLN
INTRAVENOUS | Status: DC
  Administered 2017-12-18: 09:00:00 via INTRAVENOUS

## 2017-12-18 MED ORDER — PROPOFOL 500 MG/50ML IV EMUL
INTRAVENOUS | Status: AC
  Filled 2017-12-18: qty 50

## 2017-12-18 MED ORDER — LIDOCAINE HCL (CARDIAC) PF 100 MG/5ML IV SOSY
PREFILLED_SYRINGE | INTRAVENOUS | Status: DC | PRN
  Administered 2017-12-18: 50 mg via INTRAVENOUS

## 2017-12-18 MED ORDER — PHENYLEPHRINE HCL 10 MG/ML IJ SOLN
INTRAMUSCULAR | Status: DC | PRN
  Administered 2017-12-18 (×3): 100 ug via INTRAVENOUS

## 2017-12-18 NOTE — Anesthesia Post-op Follow-up Note (Signed)
Anesthesia QCDR form completed.        

## 2017-12-18 NOTE — Anesthesia Preprocedure Evaluation (Signed)
Anesthesia Evaluation  Patient identified by MRN, date of birth, ID band Patient awake    Reviewed: Allergy & Precautions, NPO status , Patient's Chart, lab work & pertinent test results  Airway Mallampati: II  TM Distance: >3 FB     Dental   Pulmonary shortness of breath and with exertion,    Pulmonary exam normal        Cardiovascular hypertension, Normal cardiovascular exam+ dysrhythmias Atrial Fibrillation      Neuro/Psych  Neuromuscular disease negative psych ROS   GI/Hepatic negative GI ROS, Neg liver ROS,   Endo/Other  diabetes  Renal/GU negative Renal ROS  negative genitourinary   Musculoskeletal negative musculoskeletal ROS (+)   Abdominal Normal abdominal exam  (+)   Peds negative pediatric ROS (+)  Hematology negative hematology ROS (+)   Anesthesia Other Findings Past Medical History: 1993: Atrial fibrillation (HCC) No date: Diabetes mellitus without complication (HCC) No date: History of kidney stones No date: Hypercholesteremia No date: Hypertension  Reproductive/Obstetrics                             Anesthesia Physical Anesthesia Plan  ASA: III  Anesthesia Plan: General   Post-op Pain Management:    Induction: Intravenous  PONV Risk Score and Plan:   Airway Management Planned: Nasal Cannula  Additional Equipment:   Intra-op Plan:   Post-operative Plan:   Informed Consent: I have reviewed the patients History and Physical, chart, labs and discussed the procedure including the risks, benefits and alternatives for the proposed anesthesia with the patient or authorized representative who has indicated his/her understanding and acceptance.     Plan Discussed with: CRNA and Surgeon  Anesthesia Plan Comments:         Anesthesia Quick Evaluation

## 2017-12-18 NOTE — H&P (Signed)
Jonathon Bellows, MD [address] [place], [place], [place], [address] [address], [place], [place], [place], [address] Phone: [phone]  Fax: [phone]  Primary Care Physician:  Ronnell Freshwater, NP   Pre-Procedure History & Physical: HPI:  Brett Wiley is a 62 y.o. male is here for an colonoscopy.   Past Medical History:  Diagnosis Date  . Atrial fibrillation ([place]) 1993  . Diabetes mellitus without complication ([place])   . History of kidney stones   . Hypercholesteremia   . Hypertension     Past Surgical History:  Procedure Laterality Date  . NO PAST SURGERIES      Prior to Admission medications   Medication Sig Start Date End Date Taking? Authorizing Provider  diltiazem (CARDIZEM CD) 240 MG 24 hr capsule Take 1 capsule (240 mg total) by mouth daily. 08/10/17  Yes Ronnell Freshwater, NP  HUMALOG KWIKPEN 100 UNIT/ML KiwkPen 10 to 20 units with meal per sliding scale 10/29/17  Yes Boscia, Heather E, NP  Insulin Aspart (NOVOLOG FLEXPEN [place]) Inject into the skin.   Yes [provider]  Insulin Glargine (BASAGLAR KWIKPEN) 100 UNIT/ML SOPN Inject 0.6 mLs (60 Units total) into the skin daily. Inject up to  60 units every evening 11/06/17  Yes Boscia, Heather E, NP  lisinopril (PRINIVIL,ZESTRIL) 2.5 MG tablet Take 1 tablet (2.5 mg total) by mouth daily. 09/10/17  Yes Boscia, Greer Ee, NP  lovastatin (MEVACOR) 20 MG tablet Take 1 tablet (20 mg total) by mouth at bedtime. 10/22/17  Yes Boscia, Heather E, NP  Semaglutide (OZEMPIC) 1 MG/DOSE SOPN Inject 0.5 mg into the skin once a week. 11/27/17  Yes Ronnell Freshwater, NP  ONETOUCH DELICA LANCETS 61W MISC  11/27/16   [provider]  [facility] VERIO test strip  01/09/17   [provider]  sildenafil (REVATIO) 20 MG tablet Take 1 tablet (20 mg total) by mouth daily as needed. 08/13/17   Ronnell Freshwater, NP    Allergies as of 11/29/2017  . (No Known Allergies)    Family History  Problem Relation Age of  Onset  . Diabetes Mother   . Diabetes Father   . Hypertension Father     Social History   Socioeconomic History  . Marital status: Single    Spouse name: Not on file  . Number of children: Not on file  . Years of education: Not on file  . Highest education level: Not on file  Occupational History  . Not on file  Social Needs  . Financial resource strain: Not on file  . Food insecurity:    Worry: Not on file    Inability: Not on file  . Transportation needs:    Medical: Not on file    Non-medical: Not on file  Tobacco Use  . Smoking status: Never Smoker  . Smokeless tobacco: Never Used  Substance and Sexual Activity  . Alcohol use: No    Alcohol/week: 0.0 oz  . Drug use: No  . Sexual activity: Not on file  Lifestyle  . Physical activity:    Days per week: Not on file    Minutes per session: Not on file  . Stress: Not on file  Relationships  . Social connections:    Talks on phone: Not on file    Gets together: Not on file    Attends religious service: Not on file    Active member of club or organization: Not on file  Attends meetings of clubs or organizations: Not on file    Relationship status: Not on file  . Intimate partner violence:    Fear of current or ex partner: Not on file    Emotionally abused: Not on file    Physically abused: Not on file    Forced sexual activity: Not on file  Other Topics Concern  . Not on file  Social History Narrative  . Not on file    Review of Systems: See HPI, otherwise negative ROS  Physical Exam: BP (!) 156/86   Pulse 83   Temp 97.9 F (36.6 C) (Tympanic)   Resp 20   Ht 5\' 11"  (1.803 m)   Wt 298 lb (135.2 kg)   SpO2 96%   BMI 41.56 kg/m  General:   Alert,  pleasant and cooperative in NAD Head:  Normocephalic and atraumatic. Neck:  Supple; no masses or thyromegaly. Lungs:  Clear throughout to auscultation, normal respiratory effort.    Heart:  +S1, +S2, Regular rate and rhythm, No edema. Abdomen:  Soft,  nontender and nondistended. Normal bowel sounds, without guarding, and without rebound.   Neurologic:  Alert and  oriented x4;  grossly normal neurologically.  Impression/Plan: Brett Wiley is here for an colonoscopy to be performed for Screening colonoscopy average risk   Risks, benefits, limitations, and alternatives regarding  colonoscopy have been reviewed with the patient.  Questions have been answered.  All parties agreeable.   Jonathon Bellows, MD  12/18/2017, 8:47 AM

## 2017-12-18 NOTE — Transfer of Care (Signed)
Immediate Anesthesia Transfer of Care Note  Patient: Brett Wiley  Procedure(s) Performed: COLONOSCOPY WITH PROPOFOL (N/A )  Patient Location: PACU  Anesthesia Type:General  Level of Consciousness: awake and alert   Airway & Oxygen Therapy: Patient Spontanous Breathing  Post-op Assessment: Report given to RN and Post -op Vital signs reviewed and stable  Post vital signs: Reviewed and stable  Last Vitals:  Vitals Value Taken Time  BP 93/50 12/18/2017  9:24 AM  Temp 36.2 C 12/18/2017  9:24 AM  Pulse 72 12/18/2017  9:25 AM  Resp 25 12/18/2017  9:25 AM  SpO2 98 % 12/18/2017  9:25 AM  Vitals shown include unvalidated device data.  Last Pain:  Vitals:   12/18/17 0924  TempSrc: Tympanic  PainSc: Asleep         Complications: No apparent anesthesia complications

## 2017-12-18 NOTE — Op Note (Signed)
[facility] Gastroenterology Patient Name: Brett Wiley Procedure Date: 12/18/2017 8:50 AM MRN: [id number] Account #: [id number] Date of Birth: 1955/08/27 Admit Type: Outpatient Age: 62 Room: [facility] ENDO ROOM 1 Gender: Male Note Status: Finalized Procedure:            Colonoscopy Indications:          Screening for colorectal malignant neoplasm Providers:            Jonathon Bellows MD, MD Referring MD:         Leretha Pol Medicines:            Monitored Anesthesia Care Complications:        No immediate complications. Procedure:            Pre-Anesthesia Assessment:                       - Prior to the procedure, a History and Physical was                        performed, and patient medications, allergies and                        sensitivities were reviewed. The patient's tolerance of                        previous anesthesia was reviewed.                       - The risks and benefits of the procedure and the                        sedation options and risks were discussed with the                        patient. All questions were answered and informed                        consent was obtained.                       - ASA Grade Assessment: II - A patient with mild                        systemic disease.                       After obtaining informed consent, the colonoscope was                        passed under direct vision. Throughout the procedure,                        the patient's blood pressure, pulse, and oxygen                        saturations were monitored continuously. The                        Colonoscope was introduced through the anus and                        advanced to  the the cecum, identified by the                        appendiceal orifice, IC valve and transillumination.                        The colonoscopy was performed with ease. The patient                        tolerated the procedure well. The quality of the bowel                    preparation was good. Findings:      The perianal and digital rectal examinations were normal.      A 6 mm polyp was found in the sigmoid colon. The polyp was sessile. The       polyp was removed with a cold snare. Resection and retrieval were       complete.      A few small-mouthed diverticula were found in the right colon.      A 10 mm polypoid lesion was found in the distal rectum. The lesion was       either a thrombosed hemoroid, skin tag vs condyloma [Bleeding].      The exam was otherwise without abnormality on direct and retroflexion       views. Impression:           - One 6 mm polyp in the sigmoid colon, removed with a                        cold snare. Resected and retrieved.                       - Diverticulosis in the right colon.                       - Polypoid lesion in the distal rectum.                       - The examination was otherwise normal on direct and                        retroflexion views. Recommendation:       - Discharge patient to home (with escort).                       - Resume previous diet.                       - Continue present medications.                       - Await pathology results.                       - Repeat colonoscopy for surveillance based on                        pathology results.                       - Refer to surgery to excise lesion in distal  rectum/anal canal Procedure Code(s):    --- Professional ---                       [phone], Colonoscopy, flexible; with removal of tumor(s),                        polyp(s), or other lesion(s) by snare technique Diagnosis Code(s):    --- Professional ---                       Z12.11, Encounter for screening for malignant neoplasm                        of colon                       D12.5, Benign neoplasm of sigmoid colon                       D49.0, Neoplasm of unspecified behavior of digestive                        system                        K57.30, Diverticulosis of large intestine without                        perforation or abscess without bleeding CPT copyright 2017 American Medical Association. All rights reserved. The codes documented in this report are preliminary and upon coder review may  be revised to meet current compliance requirements. Jonathon Bellows, MD Jonathon Bellows MD, MD 12/18/2017 9:22:53 AM This report has been signed electronically. Number of Addenda: 0 Note Initiated On: 12/18/2017 8:50 AM Scope Withdrawal Time: 0 hours 16 minutes 20 seconds  Total Procedure Duration: 0 hours 20 minutes 28 seconds       [facility]

## 2017-12-18 NOTE — Anesthesia Procedure Notes (Signed)
Date/Time: 12/18/2017 8:55 AM Performed by: Johnna Acosta, CRNA Pre-anesthesia Checklist: Patient identified, Emergency Drugs available, Suction available, Patient being monitored and Timeout performed Patient Re-evaluated:Patient Re-evaluated prior to induction Oxygen Delivery Method: Nasal cannula Preoxygenation: Pre-oxygenation with 100% oxygen

## 2017-12-18 NOTE — Anesthesia Postprocedure Evaluation (Signed)
Anesthesia Post Note  Patient: Brett Wiley  Procedure(s) Performed: COLONOSCOPY WITH PROPOFOL (N/A )  Patient location during evaluation: PACU Anesthesia Type: General Level of consciousness: awake and alert and oriented Pain management: pain level controlled Vital Signs Assessment: post-procedure vital signs reviewed and stable Respiratory status: spontaneous breathing Cardiovascular status: blood pressure returned to baseline Anesthetic complications: no     Last Vitals:  Vitals:   12/18/17 0944 12/18/17 0954  BP: 122/90 122/86  Pulse:    Resp: (!) 24   Temp:    SpO2:      Last Pain:  Vitals:   12/18/17 0944  TempSrc:   PainSc: 0-No pain                 Kruti Horacek

## 2017-12-19 ENCOUNTER — Encounter: Payer: Self-pay | Admitting: Gastroenterology

## 2017-12-19 LAB — SURGICAL PATHOLOGY

## 2017-12-21 ENCOUNTER — Telehealth: Payer: Self-pay

## 2017-12-21 NOTE — Telephone Encounter (Signed)
Referral faxed to [facility] for consultation.

## 2017-12-21 NOTE — Telephone Encounter (Signed)
-----   Message from Jonathon Bellows, MD sent at 12/18/2017 10:09 AM EDT ----- Regarding: please arrange appointment  Brett Wiley,  Please arrange referral to surgery for removal of a polyp vs condyloma in the anal/rectal area   Regards    Dr Jonathon Bellows  Gastroenterology/Hepatology Pager: [phone]

## 2017-12-24 ENCOUNTER — Telehealth: Payer: Self-pay | Admitting: Gastroenterology

## 2017-12-24 NOTE — Telephone Encounter (Signed)
Pamala Hurry from [place] left vm pt has been referred to see Dr. Dema Severin and is scheduled forJuly 29th at 1:45 and has been informed  Her cb [phone]

## 2018-01-07 ENCOUNTER — Ambulatory Visit: Payer: Self-pay | Admitting: Surgery

## 2018-01-07 DIAGNOSIS — K620 Anal polyp: Secondary | ICD-10-CM | POA: Diagnosis not present

## 2018-01-07 NOTE — H&P (Signed)
CC: Referred by Dr. Vicente Males for evaluation of anal canal polypoid lesion  HPI: Brett Wiley is a very pleasant 14yoM with hx of HTN, DM, HLD, prior afib (1992) currently not on anticoagulation presents for evaluation following colonoscopy 12/31/17 by Dr. Vicente Males. Findings were remarkable for small polyp in Brett sigmoid, removed-TA; right-sided diverticulosis; 1 cm polypoid lesion in Brett anal canalno biopsies. He reports a history of rare bright red blood per rectum occurring 3 times per year. He reports having a bowel movement every day to every other day and its usually soft but occasionally hard. He does not take a fiber supplement. He drinks less than 64 ounces of water per day. He spends 5-10 minutes on Brett commode.  PMH: HTN (well-controlled on oral antihypertensives); diabetes mellitus (well controlled on insulin); HLD (well controlled on statin)  PSH: Denies any prior anorectal procedures  FHx: Denies FHx of malignancy  Social: Denies use of tobacco/EtOH/drugs  ROS: A comprehensive 10 system review of systems was completed with Brett Wiley and pertinent findings as noted above.  Brett Wiley is a 62 year old male.   Past Surgical History Alean Rinne, [place]; 01/07/2018 1:44 PM) Colon Polyp Removal - Colonoscopy   Diagnostic Studies History Alean Rinne, [place]; 01/07/2018 1:44 PM) Colonoscopy  within last year  Allergies Alean Rinne, RMA; 01/07/2018 1:45 PM) No Known Drug Allergies [01/07/2018]: Allergies Reconciled   Medication History Alean Rinne, [place]; 01/07/2018 1:47 PM) Nancee Liter KwikPen (100UNIT/ML Soln Pen-inj, Subcutaneous) Active. Cartia XT (240MG  Capsule ER 24HR, Oral) Active. HumaLOG KwikPen (100UNIT/ML Soln Pen-inj, Subcutaneous) Active. Lisinopril (2.5MG  Tablet, Oral) Active. Lovastatin (20MG  Tablet, Oral) Active. Ozempic (0.25 or 0.5MG /DOSE Soln Pen-inj, Subcutaneous) Active. Sildenafil Citrate (20MG  Tablet, Oral) Active. DilTIAZem HCl (240MG /24HR Capsule  ER, Oral) Active. Medications Reconciled  Social History Alean Rinne, [place]; 01/07/2018 1:44 PM) Alcohol use  Remotely quit alcohol use. Caffeine use  Tea. No drug use  Tobacco use  Never smoker.  Family History Alean Rinne, [place]; 01/07/2018 1:44 PM) Family history unknown  First Degree Relatives   Other Problems Alean Rinne, [place]; 01/07/2018 1:44 PM) Diabetes Mellitus  High blood pressure  Hypercholesterolemia  Kidney Stone     Review of Systems Alean Rinne RMA; 01/07/2018 1:44 PM) General Not Present- Appetite Loss, Chills, Fatigue, Fever, Night Sweats, Weight Gain and Weight Loss. Skin Not Present- Change in Wart/Mole, Dryness, Hives, Jaundice, New Lesions, Non-Healing Wounds, Rash and Ulcer. HEENT Not Present- Earache, Hearing Loss, Hoarseness, Nose Bleed, Oral Ulcers, Ringing in Brett Ears, Seasonal Allergies, Sinus Pain, Sore Throat, Visual Disturbances, Wears glasses/contact lenses and Yellow Eyes. Respiratory Not Present- Bloody sputum, Chronic Cough, Difficulty Breathing, Snoring and Wheezing. Cardiovascular Not Present- Chest Pain, Difficulty Breathing Lying Down, Leg Cramps, Palpitations, Rapid Heart Rate, Shortness of Breath and Swelling of Extremities. Gastrointestinal Present- Excessive gas. Not Present- Abdominal Pain, Bloating, Bloody Stool, Change in Bowel Habits, Chronic diarrhea, Constipation, Difficulty Swallowing, Gets full quickly at meals, Hemorrhoids, Indigestion, Nausea, Rectal Pain and Vomiting. Male Genitourinary Not Present- Blood in Urine, Change in Urinary Stream, Frequency, Impotence, Nocturia, Painful Urination, Urgency and Urine Leakage. Musculoskeletal Not Present- Back Pain, Joint Pain, Joint Stiffness, Muscle Pain, Muscle Weakness and Swelling of Extremities. Neurological Not Present- Decreased Memory, Fainting, Headaches, Numbness, Seizures, Tingling, Tremor, Trouble walking and Weakness. Psychiatric Not Present- Anxiety, Bipolar,  Change in Sleep Pattern, Depression, Fearful and Frequent crying. Endocrine Not Present- Cold Intolerance, Excessive Hunger, Hair Changes, Heat Intolerance, Hot flashes and New Diabetes.  Vitals Alean Rinne RMA; 01/07/2018 1:45 PM) 01/07/2018 1:45  PM Weight: 301.6 lb Height: 71in Body Surface Area: 2.51 m Body Mass Index: 42.06 kg/m  Temp.: 98.50F  Pulse: 93 (Regular)        Physical Exam [name] MD; 01/07/2018 2:07 PM) Brett physical exam findings are as follows: Note:Constitutional: No acute distress; conversant; no deformities Eyes: Moist conjunctiva; no lid lag; anicteric sclerae; pupils equal round and reactive to light Neck: Trachea midline; no palpable thyromegaly Lungs: Normal respiratory effort; no tactile fremitus CV: Regular rate and rhythm; no palpable thrill; no pitting edema GI: Abdomen soft, nontender, nondistended; no palpable hepatosplenomegaly Anorectal: ?tiny subcutaneous anal fistula externally; DRE - no palpable masses Anoscopy: Small internal hemorrhoids; discrete polypoid mass not visualized but anoscopy limited by patients habitus and tolerance MSK: Normal gait; no clubbing/cyanosis Psychiatric: Appropriate affect; alert and oriented 3 Lymphatic: No palpable cervical or axillary lymphadenopathy **A chaperone, Lennart Pall, was present for Brett entire physical exam    Assessment & Plan [place]rell Gave M. Kimika Streater MD; 01/07/2018 2:10 PM) ANAL POLYP (K62.0) Story: Mr. Sauls is a very pleasant 55yoM with hx of HTN, DM, HLD, prior afib (1992) currently not on anticoagulation here today for evaluation of anal canal polypoid lesion found on colonoscopy 12/31/17 Impression: -Brett anatomy and physiology of Brett anal canal was discussed at length with Brett Wiley. Brett pathophysiology of anal canal polyps and hemorrhoids was discussed at length as well. -We discussed options moving forward including observation vs excision with benefits of excision  being to obtain a tissue diagnosis. He opted to pursue excision. -Brett planned procedure, material risks (including, but not limited to, pain, bleeding, infection, need for blood transfusion, damage to anal sphincter, incontinence of gas and/or stool, need for additional procedures, recurrence, pneumonia, heart attack, stroke, death) benefits and alternatives to surgery were discussed at length. Brett Wiley's questions were answered to his satisfaction, he voiced understanding and elected to proceed with surgery. Additionally, we discussed typical postoperative expectations and Brett recovery process. -Additionally, I recommended he begin taking a daily fiber supplement (Metamucil/Citrucel/FiberCon/Benefiber/Konsyl), drinks 64 ounces of water per day (8, 8 ounce glasses) and minimize time on commode 2-3 minutes. Current Plans ANOSCOPY, DIAGNOSTIC [phone]) (Anorectal: ?tiny subcutaneous anal fistula externally; DRE - no palpable masses; Anoscopy: Small internal hemorrhoids; discrete polypoid mass not visualized but anoscopy limited by patients habitus and tolerance) Signed electronically by Ileana Roup, MD (01/07/2018 2:10 PM)

## 2018-02-04 ENCOUNTER — Other Ambulatory Visit: Payer: Self-pay

## 2018-02-04 MED ORDER — LISINOPRIL 2.5 MG PO TABS: 2.5000 mg | ORAL_TABLET | Freq: Every day | ORAL | 3 refills | Status: DC

## 2018-02-26 ENCOUNTER — Ambulatory Visit: Payer: 59 | Admitting: Nurse Practitioner

## 2018-02-26 ENCOUNTER — Encounter: Payer: Self-pay | Admitting: Nurse Practitioner

## 2018-02-26 VITALS — BP 133/82 | HR 73 | Resp 16 | Ht 71.5 in | Wt 298.0 lb

## 2018-02-26 DIAGNOSIS — L03032 Cellulitis of left toe: Secondary | ICD-10-CM

## 2018-02-26 DIAGNOSIS — E1165 Type 2 diabetes mellitus with hyperglycemia: Secondary | ICD-10-CM | POA: Diagnosis not present

## 2018-02-26 DIAGNOSIS — I10 Essential (primary) hypertension: Secondary | ICD-10-CM

## 2018-02-26 LAB — POCT GLYCOSYLATED HEMOGLOBIN (HGB A1C): Hemoglobin A1C: 6.6 % — AB (ref 4.0–5.6)

## 2018-02-26 MED ORDER — CLINDAMYCIN HCL 300 MG PO CAPS: 300.0000 mg | ORAL_CAPSULE | Freq: Three times a day (TID) | ORAL | 0 refills | Status: DC

## 2018-02-26 MED ORDER — BASAGLAR KWIKPEN 100 UNIT/ML ~~LOC~~ SOPN: PEN_INJECTOR | SUBCUTANEOUS | 3 refills | Status: DC
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
## 2018-02-27 ENCOUNTER — Telehealth: Payer: Self-pay

## 2018-02-27 NOTE — Telephone Encounter (Signed)
Spoke with patient and informed him that his diabetic eye and podiatry have been scheduled

## 2018-03-01 ENCOUNTER — Other Ambulatory Visit: Payer: Self-pay

## 2018-03-01 DIAGNOSIS — E1165 Type 2 diabetes mellitus with hyperglycemia: Secondary | ICD-10-CM

## 2018-03-01 MED ORDER — BASAGLAR KWIKPEN 100 UNIT/ML ~~LOC~~ SOPN: PEN_INJECTOR | SUBCUTANEOUS | 3 refills | Status: DC

## 2018-03-04 ENCOUNTER — Other Ambulatory Visit: Payer: Self-pay

## 2018-03-04 DIAGNOSIS — E1165 Type 2 diabetes mellitus with hyperglycemia: Secondary | ICD-10-CM

## 2018-03-04 MED ORDER — BASAGLAR KWIKPEN 100 UNIT/ML ~~LOC~~ SOPN: PEN_INJECTOR | SUBCUTANEOUS | 3 refills | Status: DC

## 2018-03-06 ENCOUNTER — Other Ambulatory Visit: Payer: Self-pay

## 2018-03-06 ENCOUNTER — Encounter: Payer: Self-pay | Admitting: Adult Health

## 2018-03-06 ENCOUNTER — Ambulatory Visit (INDEPENDENT_AMBULATORY_CARE_PROVIDER_SITE_OTHER): Payer: 59 | Admitting: Adult Health

## 2018-03-06 VITALS — BP 128/90 | HR 64 | Temp 98.1°F | Resp 16 | Ht 71.5 in | Wt 299.0 lb

## 2018-03-06 DIAGNOSIS — E11628 Type 2 diabetes mellitus with other skin complications: Secondary | ICD-10-CM

## 2018-03-06 DIAGNOSIS — L089 Local infection of the skin and subcutaneous tissue, unspecified: Secondary | ICD-10-CM | POA: Diagnosis not present

## 2018-03-06 DIAGNOSIS — I10 Essential (primary) hypertension: Secondary | ICD-10-CM | POA: Diagnosis not present

## 2018-03-06 DIAGNOSIS — L03032 Cellulitis of left toe: Secondary | ICD-10-CM | POA: Insufficient documentation

## 2018-03-06 MED ORDER — DILTIAZEM HCL ER COATED BEADS 240 MG PO CP24: 240.0000 mg | ORAL_CAPSULE | Freq: Every day | ORAL | 5 refills | Status: DC

## 2018-03-06 NOTE — Patient Instructions (Signed)

## 2018-03-06 NOTE — Progress Notes (Signed)
[facility] [place], [place] [address]  Internal MEDICINE  Office Visit Note  Patient Name: Brett Wiley  [id number]  [id number]  Date of Service: 03/09/2018  Chief Complaint  Patient presents with  . Hypertension    bp been running high     HPI Pt is here for a sick visit. Pt was seeing Dr. Caryl Comes at [facility].  His bp was found to be 172/105.  He was told to come straight here for evaluation.  His bp on arrival was much better.  The patient has been without his bp meds for the last 4 days.  We sent his prescription today.  He will go and take his dose now. He denies headache, dizziness or feeling bad.    Current Medication:  Outpatient Encounter Medications as of 03/06/2018  Medication Sig  . clindamycin (CLEOCIN) 300 MG capsule Take 1 capsule (300 mg total) by mouth 3 (three) times daily.  Marland Kitchen diltiazem (CARDIZEM CD) 240 MG 24 hr capsule Take 1 capsule (240 mg total) by mouth daily.  Marland Kitchen HUMALOG KWIKPEN 100 UNIT/ML KiwkPen 10 to 20 units with meal per sliding scale  . Insulin Aspart (NOVOLOG FLEXPEN [place]) Inject into the skin.  . Insulin Glargine (BASAGLAR KWIKPEN) 100 UNIT/ML SOPN Inject up to 50 units every evening  . lisinopril (PRINIVIL,ZESTRIL) 2.5 MG tablet Take 1 tablet (2.5 mg total) by mouth daily.  Marland Kitchen lovastatin (MEVACOR) 20 MG tablet Take 1 tablet (20 mg total) by mouth at bedtime.  [name] DELICA LANCETS 65H MISC   . ONETOUCH VERIO test strip   . Semaglutide (OZEMPIC) 1 MG/DOSE SOPN Inject 0.5 mg into the skin once a week.  . sildenafil (REVATIO) 20 MG tablet Take 1 tablet (20 mg total) by mouth daily as needed.   No facility-administered encounter medications on file as of 03/06/2018.       Medical History: Past Medical History:  Diagnosis Date  . Atrial fibrillation ([place]) 1993  . Diabetes mellitus without complication ([place])   . History of kidney stones   . Hypercholesteremia   . Hypertension      Vital Signs: BP 128/90    Pulse 64   Temp 98.1 F (36.7 C)   Resp 16   Ht 5' 11.5" (1.816 m)   Wt 299 lb (135.6 kg)   SpO2 90%   BMI 41.12 kg/m    Review of Systems  Constitutional: Negative.  Negative for chills, fatigue and unexpected weight change.  HENT: Negative.  Negative for congestion, rhinorrhea, sneezing and sore throat.   Eyes: Negative for redness.  Respiratory: Negative.  Negative for cough, chest tightness and shortness of breath.   Cardiovascular: Negative.  Negative for chest pain and palpitations.  Gastrointestinal: Negative.  Negative for abdominal pain, constipation, diarrhea, nausea and vomiting.  Endocrine: Negative.   Genitourinary: Negative.  Negative for dysuria and frequency.  Musculoskeletal: Negative.  Negative for arthralgias, back pain, joint swelling and neck pain.  Skin: Negative.  Negative for rash.  Allergic/Immunologic: Negative.   Neurological: Negative.  Negative for tremors and numbness.  Hematological: Negative for adenopathy. Does not bruise/bleed easily.  Psychiatric/Behavioral: Negative.  Negative for behavioral problems, sleep disturbance and suicidal ideas. The patient is not nervous/anxious.     Physical Exam  Constitutional: He is oriented to person, place, and time. He appears well-developed and well-nourished. No distress.  HENT:  Head: Normocephalic and atraumatic.  Mouth/Throat: Oropharynx is clear and moist. No oropharyngeal exudate.  Eyes: Pupils  are equal, round, and reactive to light. EOM are normal.  Neck: Normal range of motion. Neck supple. No JVD present. No tracheal deviation present. No thyromegaly present.  Cardiovascular: Normal rate, regular rhythm and normal heart sounds. Exam reveals no gallop and no friction rub.  No murmur heard. Pulmonary/Chest: Effort normal and breath sounds normal. No respiratory distress. He has no wheezes. He has no rales. He exhibits no tenderness.  Abdominal: Soft. There is no tenderness. There is no guarding.   Musculoskeletal: Normal range of motion.  Lymphadenopathy:    He has no cervical adenopathy.  Neurological: He is alert and oriented to person, place, and time. No cranial nerve deficit.  Skin: Skin is warm and dry. He is not diaphoretic.  Psychiatric: He has a normal mood and affect. His behavior is normal. Judgment and thought content normal.  Nursing note and vitals reviewed.   Assessment/Plan: 1. Essential hypertension Improved in clinic.  Encouraged patient to go pick up medicine and start taking immediately.   2. Diabetic foot infection ([place]) Healing, saw podiatry today.   General Counseling: Brett Wiley verbalizes understanding of the findings of todays visit and agrees with plan of treatment. I have discussed any further diagnostic evaluation that may be needed or ordered today. We also reviewed his medications today. he has been encouraged to call the office with any questions or concerns that should arise related to todays visit.   No orders of the defined types were placed in this encounter.   No orders of the defined types were placed in this encounter.   Time spent: 25 Minutes  This patient was seen by Orson Gear AGNP-C in Collaboration with Dr Lavera Guise as a part of collaborative care agreement

## 2018-03-20 ENCOUNTER — Ambulatory Visit: Payer: 59 | Admitting: Internal Medicine

## 2018-04-07 ENCOUNTER — Encounter: Payer: Self-pay | Admitting: Gastroenterology

## 2018-04-15 ENCOUNTER — Other Ambulatory Visit: Payer: Self-pay

## 2018-04-15 ENCOUNTER — Telehealth: Payer: Self-pay

## 2018-04-15 DIAGNOSIS — D490 Neoplasm of unspecified behavior of digestive system: Secondary | ICD-10-CM

## 2018-04-15 NOTE — Telephone Encounter (Signed)
Spoke with pt and informed him of Dr. Georgeann Oppenheim instructions to refer pt to a different surgeon to have polyp removed. Pt is aware that he'll be contacted by [facility] to schedule.

## 2018-04-15 NOTE — Telephone Encounter (Signed)
-----   Message from Jonathon Bellows, MD sent at 04/07/2018 12:54 PM EDT ----- [name] please refer to a different surgeon     ----- Message ----- From: Rushie Chestnut, [place] Sent: 03/29/2018   1:07 PM EDT To: Jonathon Bellows, MD  Spoke with pt regarding anal polyp. Pt states after his consult appointment with the surgeon but he did not schedule the procedure because the surgeon made him feel uncomfortable while examining the polyp. Pt states the surgeon "poked" him three times during the examination and did not explain to him what was happening. Pt is not opposed to seeing a different surgeon to have the polyp removed.  Brett Wiley  ----- Message ----- From: Jonathon Bellows, MD Sent: 03/24/2018   2:28 PM EDT To: Ronnell Freshwater, NP, Rushie Chestnut, CMA  Brett Wiley can you check if he was seen byu surgeon for polyp near anus?

## 2018-05-02 ENCOUNTER — Ambulatory Visit: Payer: Self-pay | Admitting: Surgery

## 2018-05-13 ENCOUNTER — Other Ambulatory Visit: Payer: Self-pay

## 2018-05-13 MED ORDER — LOVASTATIN 20 MG PO TABS: 20.0000 mg | ORAL_TABLET | Freq: Every day | ORAL | 1 refills | Status: DC

## 2018-05-14 ENCOUNTER — Ambulatory Visit (INDEPENDENT_AMBULATORY_CARE_PROVIDER_SITE_OTHER): Payer: 59 | Admitting: Surgery

## 2018-05-14 ENCOUNTER — Encounter: Payer: Self-pay | Admitting: Surgery

## 2018-05-14 ENCOUNTER — Other Ambulatory Visit: Payer: Self-pay

## 2018-05-14 VITALS — BP 134/84 | HR 83 | Temp 97.3°F | Resp 20 | Ht 71.5 in | Wt 302.8 lb

## 2018-05-14 DIAGNOSIS — K635 Polyp of colon: Secondary | ICD-10-CM | POA: Diagnosis not present

## 2018-05-14 MED ORDER — LOVASTATIN 20 MG PO TABS: 20.0000 mg | ORAL_TABLET | Freq: Every day | ORAL | 1 refills | Status: DC

## 2018-05-14 NOTE — Patient Instructions (Addendum)
Patient needs to be scheduled for surgery with Dr.Davis.  Call the office with any questions or concerns.   The patient is scheduled with Dr Rosana Hoes for surgery at [facility] on 05/24/18. He will pre admit by phone. The patient is aware of date and instructions.

## 2018-05-15 NOTE — H&P (View-Only) (Signed)
Surgical Clinic History and Physical  Referring provider:  Ronnell Freshwater, NP [name], [place] [address]  HISTORY OF PRESENT ILLNESS (HPI):  62 y.o. male presents for evaluation of an anorectal polyp. Patient reports he underwent screening colonoscopy this past summer (Dr. Vicente Males, 12/18/2017), at which time a 10 mm polypoid lesion was noted in patient's distal rectum/proximal anal canal, which was felt to be not amenable to endoscopic resection. Surgical evaluation and management was advised. It appears patient was seen by Dr. Dema Severin in [place], but patient requested referral to a different surgeon. Patient denies a history of constipation, but describes he sometimes has to strain to pass hard BM's with occasional bright red blood per rectum, for which he denies having ever been advised to do or take anything. Though not specified on his colonoscopy, patient also says he's been diagnosed with hemorrhoids and says he occasionally feels an anorectal mass after BM's, but denies peri-anal pain, itching, or burning. He otherwise denies any recent unintentional weight loss, abdominal pain, N/V, fever/chills, CP, or SOB.  PAST MEDICAL HISTORY (PMH):  Past Medical History:  Diagnosis Date  . Atrial fibrillation ([place]) 1993  . Diabetes mellitus without complication ([place])   . History of kidney stones   . Hypercholesteremia   . Hypertension     PAST SURGICAL HISTORY ([place]):  Past Surgical History:  Procedure Laterality Date  . COLONOSCOPY WITH PROPOFOL N/A 12/18/2017   Procedure: COLONOSCOPY WITH PROPOFOL;  Surgeon: Jonathon Bellows, MD;  Location[facility] ENDOSCOPY;  Service: Gastroenterology;  Laterality: N/A;  . NO PAST SURGERIES      MEDICATIONS:  Prior to Admission medications   Medication Sig Start Date End Date Taking? Authorizing Provider  cephALEXin (KEFLEX) 500 MG capsule  02/06/18   [provider]  HUMALOG KWIKPEN 100 UNIT/ML KiwkPen 10 to 20 units with meal per sliding  scale 10/29/17   Ronnell Freshwater, NP  Insulin Glargine (BASAGLAR KWIKPEN) 100 UNIT/ML SOPN Inject up to 50 units every evening 03/04/18   Ronnell Freshwater, NP  lisinopril (PRINIVIL,ZESTRIL) 2.5 MG tablet Take 1 tablet (2.5 mg total) by mouth daily. 02/04/18   Ronnell Freshwater, NP  lovastatin (MEVACOR) 20 MG tablet Take 1 tablet (20 mg total) by mouth at bedtime. 05/14/18   Ronnell Freshwater, NP  mupirocin ointment (BACTROBAN) 2 %  02/18/18   [provider]  [facility] VERIO test strip  01/09/17   [provider]    ALLERGIES:  No Known Allergies   SOCIAL HISTORY:  Social History   Socioeconomic History  . Marital status: Single    Spouse name: Not on file  . Number of children: Not on file  . Years of education: Not on file  . Highest education level: Not on file  Occupational History  . Not on file  Social Needs  . Financial resource strain: Not on file  . Food insecurity:    Worry: Not on file    Inability: Not on file  . Transportation needs:    Medical: Not on file    Non-medical: Not on file  Tobacco Use  . Smoking status: Never Smoker  . Smokeless tobacco: Never Used  Substance and Sexual Activity  . Alcohol use: No    Alcohol/week: 0.0 standard drinks  . Drug use: No  . Sexual activity: Not on file  Lifestyle  . Physical activity:    Days per week: Not on file    Minutes per session: Not on file  .  Stress: Not on file  Relationships  . Social connections:    Talks on phone: Not on file    Gets together: Not on file    Attends religious service: Not on file    Active member of club or organization: Not on file    Attends meetings of clubs or organizations: Not on file    Relationship status: Not on file  . Intimate partner violence:    Fear of current or ex partner: Not on file    Emotionally abused: Not on file    Physically abused: Not on file    Forced sexual activity: Not on file  Other Topics Concern  . Not on file  Social History  Narrative  . Not on file    The patient currently resides (home / rehab facility / nursing home): Home The patient normally is (ambulatory / bedbound): Ambulatory  FAMILY HISTORY:  Family History  Problem Relation Age of Onset  . Diabetes Mother   . Diabetes Father   . Hypertension Father     Otherwise negative/non-contributory.  REVIEW OF SYSTEMS:  Constitutional: denies any other weight loss, fever, chills, or sweats  Eyes: denies any other vision changes, history of eye injury  ENT: denies sore throat, hearing problems  Respiratory: denies shortness of breath, wheezing  Cardiovascular: denies chest pain, palpitations  Gastrointestinal: abdominal pain, N/V, and bowel function as per HPI Musculoskeletal: denies any other joint pains or cramps  Skin: Denies any other rashes or skin discolorations Neurological: denies any other headache, dizziness, weakness  Psychiatric: Denies any other depression, anxiety   All other review of systems were otherwise negative   VITAL SIGNS:  BP 134/84   Pulse 83   Temp (!) 97.3 F (36.3 C) (Temporal)   Resp 20   Ht 5' 11.5" (1.816 m)   Wt (!) 302 lb 12.8 oz (137.3 kg)   SpO2 98%   BMI 41.64 kg/m    PHYSICAL EXAM:  Constitutional:  -- Obese body habitus  -- Awake, alert, and oriented x3  Eyes:  -- Pupils equally round and reactive to light  -- No scleral icterus  Ear, nose, throat:  -- No jugular venous distension -- No nasal drainage, bleeding Pulmonary:  -- No crackles  -- Equal breath sounds bilaterally -- Breathing non-labored at rest Cardiovascular:  -- S1, S2 present  -- No pericardial rubs  Gastrointestinal:  -- Abdomen soft, nontender, non-distended, no guarding/rebound  -- No abdominal masses appreciated, pulsatile or otherwise  Anorectal: -- Anal sphincter tone WNL, no gross blood appreciated -- No prolapsed hemorrhoids, anal fissure or fistula appreciated -- No palpable mass(es) appreciated Musculoskeletal  and Integumentary:  -- Wounds or skin discoloration: None appreciated -- Extremities: B/L UE and LE FROM, hands and feet warm  Neurologic:  -- Motor function: Intact and symmetric -- Sensation: Intact and symmetric  Labs:  CBC Latest Ref Rng & Units 04/09/2015  WBC 3.8 - 10.6 K/uL 5.8  Hemoglobin 13.0 - 18.0 g/dL 14.5  Hematocrit 40.0 - 52.0 % 43.5  Platelets 150 - 440 K/uL 260   CMP Latest Ref Rng & Units 04/09/2015  Glucose 65 - 99 mg/dL 161(H)  BUN 6 - 20 mg/dL 14  Creatinine 0.61 - 1.24 mg/dL 0.74  Sodium 135 - 145 mmol/L 135  Potassium 3.5 - 5.1 mmol/L 3.8  Chloride 101 - 111 mmol/L 105  CO2 22 - 32 mmol/L 24  Calcium 8.9 - 10.3 mg/dL 9.4  Total Protein 6.5 - 8.1 g/dL  8.3(H)  Total Bilirubin 0.3 - 1.2 mg/dL 0.9  Alkaline Phos 38 - 126 U/L 66  AST 15 - 41 U/L 21  ALT 17 - 63 U/L 22   Imaging studies:  Colonoscopy (Dr. Vicente Males, 01/12/18) - report and images personally reviewed and discussed with patient - Diverticulosis in the right colon. - One 6 mm polyp in the sigmoid colon, removed with a cold snare. Resected and retrieved. - Polypoid lesion in the distal rectum. Will refer to surgery to excise lesion in distal rectum/anal canal. - The examination was otherwise normal on direct and retroflexion views.  Assessment/Plan: 62 y.o. male with a 10 mm anorectal polyp not amenable to endoscopic resection, complicated by pertinent comorbidities including morbid obesity (BMI 42), DM, HTN, hypercholesterolemia, a remote history of atrial fibrillation (1993) for which patient is not on chronic systemic anticoagulation, and nephrolithiasis.               - discussed with patient colonoscopy results and differential diagnoses             - maintain hydration with high fiber heart healthy diet (+/- Metamucil or similar fiber supplement if inadequate dietary fiber) +once daily Colace stool softener to reduce/minimize constipation, particularly in context of colonic diverticulosis and  hemorrhoids             - all risks, benefits, and alternatives to anorectal exam under anesthesia with anticipated transanal excision of anorectal polyp/lesion were discussed with the patient, all of his questions were answered to patient's expressed satisfaction, patient expresses he wishes to proceed, and informed consent was obtained.             - will plan for anorectal exam under anesthesia with anticipated transanal excision of anorectal polyp/lesion on 12/13 per patient request pending anesthesia and OR availability  - recommend significant weight loss for overall health benefits, will plan to offer bariatric medical and/or surgical referral/contact information at follow-up appointment             - anticipate return to clinic 2 weeks after above planned surgery             - instructed to call if any questions or concerns  All of the above recommendations were discussed with the patient, and all of patient's questions were answered to his expressed satisfaction.  Thank you for the opportunity to participate in this patient's care.  -- Marilynne Drivers Rosana Hoes, MD, [place]: [facility] - Partnering for exceptional care. Office: [phone]

## 2018-05-15 NOTE — Progress Notes (Signed)
Surgical Clinic History and Physical  Referring provider:  Ronnell Freshwater, NP [place], [place] [address]  HISTORY OF PRESENT ILLNESS (HPI):  62 y.o. male presents for evaluation of an anorectal polyp. Patient reports he underwent screening colonoscopy this past summer (Dr. Vicente Males, 12/18/2017), at which time a 10 mm polypoid lesion was noted in patient's distal rectum/proximal anal canal, which was felt to be not amenable to endoscopic resection. Surgical evaluation and management was advised. It appears patient was seen by Dr. Dema Severin in [place], but patient requested referral to a different surgeon. Patient denies a history of constipation, but describes he sometimes has to strain to pass hard BM's with occasional bright red blood per rectum, for which he denies having ever been advised to do or take anything. Though not specified on his colonoscopy, patient also says he's been diagnosed with hemorrhoids and says he occasionally feels an anorectal mass after BM's, but denies peri-anal pain, itching, or burning. He otherwise denies any recent unintentional weight loss, abdominal pain, N/V, fever/chills, CP, or SOB.  PAST MEDICAL HISTORY (PMH):  Past Medical History:  Diagnosis Date  . Atrial fibrillation ([place]) 1993  . Diabetes mellitus without complication ([place])   . History of kidney stones   . Hypercholesteremia   . Hypertension     PAST SURGICAL HISTORY ([place]):  Past Surgical History:  Procedure Laterality Date  . COLONOSCOPY WITH PROPOFOL N/A 12/18/2017   Procedure: COLONOSCOPY WITH PROPOFOL;  Surgeon: Jonathon Bellows, MD;  Location: [facility] ENDOSCOPY;  Service: Gastroenterology;  Laterality: N/A;  . NO PAST SURGERIES      MEDICATIONS:  Prior to Admission medications   Medication Sig Start Date End Date Taking? Authorizing Provider  cephALEXin (KEFLEX) 500 MG capsule  02/06/18   [provider]  HUMALOG KWIKPEN 100 UNIT/ML KiwkPen 10 to 20 units with meal per sliding  scale 10/29/17   Ronnell Freshwater, NP  Insulin Glargine (BASAGLAR KWIKPEN) 100 UNIT/ML SOPN Inject up to 50 units every evening 03/04/18   Ronnell Freshwater, NP  lisinopril (PRINIVIL,ZESTRIL) 2.5 MG tablet Take 1 tablet (2.5 mg total) by mouth daily. 02/04/18   Ronnell Freshwater, NP  lovastatin (MEVACOR) 20 MG tablet Take 1 tablet (20 mg total) by mouth at bedtime. 05/14/18   Ronnell Freshwater, NP  mupirocin ointment (BACTROBAN) 2 %  02/18/18   [provider]  [facility] VERIO test strip  01/09/17   [provider]    ALLERGIES:  No Known Allergies   SOCIAL HISTORY:  Social History   Socioeconomic History  . Marital status: Single    Spouse name: Not on file  . Number of children: Not on file  . Years of education: Not on file  . Highest education level: Not on file  Occupational History  . Not on file  Social Needs  . Financial resource strain: Not on file  . Food insecurity:    Worry: Not on file    Inability: Not on file  . Transportation needs:    Medical: Not on file    Non-medical: Not on file  Tobacco Use  . Smoking status: Never Smoker  . Smokeless tobacco: Never Used  Substance and Sexual Activity  . Alcohol use: No    Alcohol/week: 0.0 standard drinks  . Drug use: No  . Sexual activity: Not on file  Lifestyle  . Physical activity:    Days per week: Not on file    Minutes per session: Not on file  .  Stress: Not on file  Relationships  . Social connections:    Talks on phone: Not on file    Gets together: Not on file    Attends religious service: Not on file    Active member of club or organization: Not on file    Attends meetings of clubs or organizations: Not on file    Relationship status: Not on file  . Intimate partner violence:    Fear of current or ex partner: Not on file    Emotionally abused: Not on file    Physically abused: Not on file    Forced sexual activity: Not on file  Other Topics Concern  . Not on file  Social History  Narrative  . Not on file    The patient currently resides (home / rehab facility / nursing home): Home The patient normally is (ambulatory / bedbound): Ambulatory  FAMILY HISTORY:  Family History  Problem Relation Age of Onset  . Diabetes Mother   . Diabetes Father   . Hypertension Father     Otherwise negative/non-contributory.  REVIEW OF SYSTEMS:  Constitutional: denies any other weight loss, fever, chills, or sweats  Eyes: denies any other vision changes, history of eye injury  ENT: denies sore throat, hearing problems  Respiratory: denies shortness of breath, wheezing  Cardiovascular: denies chest pain, palpitations  Gastrointestinal: abdominal pain, N/V, and bowel function as per HPI Musculoskeletal: denies any other joint pains or cramps  Skin: Denies any other rashes or skin discolorations Neurological: denies any other headache, dizziness, weakness  Psychiatric: Denies any other depression, anxiety   All other review of systems were otherwise negative   VITAL SIGNS:  BP 134/84   Pulse 83   Temp (!) 97.3 F (36.3 C) (Temporal)   Resp 20   Ht 5' 11.5" (1.816 m)   Wt (!) 302 lb 12.8 oz (137.3 kg)   SpO2 98%   BMI 41.64 kg/m    PHYSICAL EXAM:  Constitutional:  -- Obese body habitus  -- Awake, alert, and oriented x3  Eyes:  -- Pupils equally round and reactive to light  -- No scleral icterus  Ear, nose, throat:  -- No jugular venous distension -- No nasal drainage, bleeding Pulmonary:  -- No crackles  -- Equal breath sounds bilaterally -- Breathing non-labored at rest Cardiovascular:  -- S1, S2 present  -- No pericardial rubs  Gastrointestinal:  -- Abdomen soft, nontender, non-distended, no guarding/rebound  -- No abdominal masses appreciated, pulsatile or otherwise  Anorectal: -- Anal sphincter tone WNL, no gross blood appreciated -- No prolapsed hemorrhoids, anal fissure or fistula appreciated -- No palpable mass(es) appreciated Musculoskeletal  and Integumentary:  -- Wounds or skin discoloration: None appreciated -- Extremities: B/L UE and LE FROM, hands and feet warm  Neurologic:  -- Motor function: Intact and symmetric -- Sensation: Intact and symmetric  Labs:  CBC Latest Ref Rng & Units 04/09/2015  WBC 3.8 - 10.6 K/uL 5.8  Hemoglobin 13.0 - 18.0 g/dL 14.5  Hematocrit 40.0 - 52.0 % 43.5  Platelets 150 - 440 K/uL 260   CMP Latest Ref Rng & Units 04/09/2015  Glucose 65 - 99 mg/dL 161(H)  BUN 6 - 20 mg/dL 14  Creatinine 0.61 - 1.24 mg/dL 0.74  Sodium 135 - 145 mmol/L 135  Potassium 3.5 - 5.1 mmol/L 3.8  Chloride 101 - 111 mmol/L 105  CO2 22 - 32 mmol/L 24  Calcium 8.9 - 10.3 mg/dL 9.4  Total Protein 6.5 - 8.1 g/dL  8.3(H)  Total Bilirubin 0.3 - 1.2 mg/dL 0.9  Alkaline Phos 38 - 126 U/L 66  AST 15 - 41 U/L 21  ALT 17 - 63 U/L 22   Imaging studies:  Colonoscopy (Dr. Vicente Males, 01/17/2018) - report and images personally reviewed and discussed with patient - Diverticulosis in the right colon. - One 6 mm polyp in the sigmoid colon, removed with a cold snare. Resected and retrieved. - Polypoid lesion in the distal rectum. Will refer to surgery to excise lesion in distal rectum/anal canal. - The examination was otherwise normal on direct and retroflexion views.  Assessment/Plan: 62 y.o. male with a 10 mm anorectal polyp not amenable to endoscopic resection, complicated by pertinent comorbidities including morbid obesity (BMI 42), DM, HTN, hypercholesterolemia, a remote history of atrial fibrillation (1993) for which patient is not on chronic systemic anticoagulation, and nephrolithiasis.               - discussed with patient colonoscopy results and differential diagnoses             - maintain hydration with high fiber heart healthy diet (+/- Metamucil or similar fiber supplement if inadequate dietary fiber) +once daily Colace stool softener to reduce/minimize constipation, particularly in context of colonic diverticulosis and  hemorrhoids             - all risks, benefits, and alternatives to anorectal exam under anesthesia with anticipated transanal excision of anorectal polyp/lesion were discussed with the patient, all of his questions were answered to patient's expressed satisfaction, patient expresses he wishes to proceed, and informed consent was obtained.             - will plan for anorectal exam under anesthesia with anticipated transanal excision of anorectal polyp/lesion on 12/13 per patient request pending anesthesia and OR availability  - recommend significant weight loss for overall health benefits, will plan to offer bariatric medical and/or surgical referral/contact information at follow-up appointment             - anticipate return to clinic 2 weeks after above planned surgery             - instructed to call if any questions or concerns  All of the above recommendations were discussed with the patient, and all of patient's questions were answered to his expressed satisfaction.  Thank you for the opportunity to participate in this patient's care.  -- Marilynne Drivers Rosana Hoes, MD, [place]: [facility] - Partnering for exceptional care. Office: [phone]

## 2018-05-20 ENCOUNTER — Inpatient Hospital Stay
Admission: RE | Admit: 2018-05-20 | Discharge: 2018-05-20 | Disposition: A | Discharge status: Home / Self Care | Payer: 59 | Source: Ambulatory Visit

## 2018-05-20 NOTE — Pre-Procedure Instructions (Signed)
ECG 12 Lead9/08/2016 [facility] Component Name Value Ref Range  EKG Systolic BP  mmHg  EKG Diastolic BP  mmHg  EKG Ventricular Rate 70 BPM  EKG Atrial Rate 70 BPM  EKG P-R Interval 192 ms  EKG QRS Duration 96 ms  EKG Q-T Interval 424 ms  EKG QTC Calculation 457 ms  EKG Calculated P Axis 46 degrees  EKG Calculated R Axis 18 degrees  EKG Calculated T Axis 27 degrees  Result Narrative  NORMAL SINUS RHYTHM NORMAL ECG NO PREVIOUS ECGS AVAILABLE Confirmed by ROSE-JONES, LISA ([id number]) on 02/12/2017 9:05:44 AM  Other Result Information  Interface, Rad Results In - 02/12/2017  9:05 AM EDT NORMAL SINUS RHYTHM NORMAL ECG NO PREVIOUS ECGS AVAILABLE Confirmed by ROSE-JONES, LISA ([id number]) on 02/12/2017 9:05:44 AM  Status Results Details   Encounter Summary

## 2018-05-21 ENCOUNTER — Other Ambulatory Visit: Payer: Self-pay

## 2018-05-21 ENCOUNTER — Encounter
Admission: RE | Admit: 2018-05-21 | Discharge: 2018-05-21 | Disposition: A | Discharge status: Home / Self Care | Payer: 59 | Source: Ambulatory Visit | Attending: Surgery | Admitting: Surgery

## 2018-05-21 HISTORY — DX: Unspecified osteoarthritis, unspecified site: M19.90

## 2018-05-21 NOTE — Patient Instructions (Addendum)
Your procedure is scheduled on: 05-24-18 FRIDAY Report to Same Day Surgery 2nd floor medical mall [facility] Entrance-take elevator on left to 2nd floor.  Check in with surgery information desk.) To find out your arrival time please call [phone] between 1PM - 3PM on 05-23-18 THURSDAY  Remember: Instructions that are not followed completely may result in serious medical risk, up to and including death, or upon the discretion of your surgeon and anesthesiologist your surgery may need to be rescheduled.    _x___ 1. Do not eat food after midnight the night before your procedure. NO GUM OR CANDY AFTER MIDNIGHT. You may drink WATER up to 2 hours before you are scheduled to arrive at the hospital for your procedure.  Do not drink WATER within 2 hours of your scheduled arrival to the hospital.  Type 1 and type 2 diabetics should only drink water.   ____Ensure clear carbohydrate drink on the way to the hospital for bariatric patients  ____Ensure clear carbohydrate drink 3 hours before surgery for Dr Dwyane Luo patients if physician instructed.      __x__ 2. No Alcohol for 24 hours before or after surgery.   __x__3. No Smoking or e-cigarettes for 24 prior to surgery.  Do not use any chewable tobacco products for at least 6 hour prior to surgery   ____  4. Bring all medications with you on the day of surgery if instructed.    __x__ 5. Notify your doctor if there is any change in your medical condition     (cold, fever, infections).    x___6. On the morning of surgery brush your teeth with toothpaste and water.  You may rinse your mouth with mouth wash if you wish.  Do not swallow any toothpaste or mouthwash.   Do not wear jewelry, make-up, hairpins, clips or nail polish.  Do not wear lotions, powders, or perfumes. You may wear deodorant.  Do not shave 48 hours prior to surgery. Men may shave face and neck.  Do not bring valuables to the hospital.    [facility] is not responsible for  any belongings or valuables.               Contacts, dentures or bridgework may not be worn into surgery.  Leave your suitcase in the car. After surgery it may be brought to your room.  For patients admitted to the hospital, discharge time is determined by your treatment team.  _  Patients discharged the day of surgery will not be allowed to drive home.  You will need someone to drive you home and stay with you the night of your procedure.    Please read over the following fact sheets that you were given:   [facility] Preparing for Surgery   _x___ TAKE THE FOLLOWING MEDICATION THE MORNING OF SURGERY WITH A SMALL SIP OF WATER. These include:  1. DILTIAZEM (CARTIA XT)  2.   3.  4.  5.  6.  _X___Fleets enema as directed-DO FLEET ENEMA AT HOME THE NIGHT BEFORE SURGERY AND ANOTHER ENEMA THE MORNING OF SURGERY AT HOME 1 HOUR PRIOR TO ARRIVAL TIME TO HOSPITAL  _x___ Use CHG Soap or sage wipes as directed on instruction sheet   ____ Use inhalers on the day of surgery and bring to hospital day of surgery  ____ Stop Metformin and Janumet 2 days prior to surgery.    _X___ Take 1/2 of usual insulin dose the night before surgery and none on the morning  surgery-TAKE HALF OF YOUR BASAGLAR Thursday NIGHT AND NO INSULIN THE MORNING OF SURGERY  ____ Follow recommendations from Cardiologist, Pulmonologist or PCP regarding stopping Aspirin, Coumadin, Plavix ,Eliquis, Effient, or Pradaxa, and Pletal.  X____Stop Anti-inflammatories such as Advil, Aleve, Ibuprofen, Motrin, Naproxen, Naprosyn, Goodies powders or aspirin products NOW-OK to take Tylenol    ____ Stop supplements until after surgery.     ____ Bring C-Pap to the hospital.

## 2018-05-22 ENCOUNTER — Encounter
Admission: RE | Admit: 2018-05-22 | Discharge: 2018-05-22 | Disposition: A | Discharge status: Home / Self Care | Payer: 59 | Source: Ambulatory Visit | Attending: Surgery | Admitting: Surgery

## 2018-05-22 DIAGNOSIS — Z79899 Other long term (current) drug therapy: Secondary | ICD-10-CM | POA: Diagnosis not present

## 2018-05-22 DIAGNOSIS — I10 Essential (primary) hypertension: Secondary | ICD-10-CM

## 2018-05-22 DIAGNOSIS — E119 Type 2 diabetes mellitus without complications: Secondary | ICD-10-CM | POA: Diagnosis not present

## 2018-05-22 DIAGNOSIS — K621 Rectal polyp: Secondary | ICD-10-CM | POA: Insufficient documentation

## 2018-05-22 DIAGNOSIS — E7800 Pure hypercholesterolemia, unspecified: Secondary | ICD-10-CM | POA: Diagnosis not present

## 2018-05-22 DIAGNOSIS — Z01818 Encounter for other preprocedural examination: Secondary | ICD-10-CM | POA: Insufficient documentation

## 2018-05-22 DIAGNOSIS — K620 Anal polyp: Secondary | ICD-10-CM | POA: Diagnosis not present

## 2018-05-22 DIAGNOSIS — A630 Anogenital (venereal) warts: Secondary | ICD-10-CM | POA: Diagnosis not present

## 2018-05-22 DIAGNOSIS — Z794 Long term (current) use of insulin: Secondary | ICD-10-CM | POA: Diagnosis not present

## 2018-05-22 DIAGNOSIS — Z6841 Body Mass Index (BMI) 40.0 and over, adult: Secondary | ICD-10-CM | POA: Diagnosis not present

## 2018-05-22 DIAGNOSIS — K648 Other hemorrhoids: Secondary | ICD-10-CM | POA: Diagnosis not present

## 2018-05-22 DIAGNOSIS — I4891 Unspecified atrial fibrillation: Secondary | ICD-10-CM

## 2018-05-22 LAB — BASIC METABOLIC PANEL
ANION GAP: 7 (ref 5–15)
BUN: 13 mg/dL (ref 6–20)
CALCIUM: 9.1 mg/dL (ref 8.9–10.3)
CO2: 23 mmol/L (ref 22–32)
Chloride: 107 mmol/L (ref 98–111)
Creatinine, Ser: 0.67 mg/dL (ref 0.61–1.24)
GFR calc Af Amer: >60 mL/min (ref >60)
GFR calc non Af Amer: >60 mL/min (ref >60)
Glucose, Bld: 107 mg/dL — ABNORMAL HIGH (ref 70–99)
POTASSIUM: 3.8 mmol/L (ref 3.5–5.1)
SODIUM: 137 mmol/L (ref 135–145)

## 2018-05-22 LAB — CBC WITH DIFFERENTIAL/PLATELET
Abs Immature Granulocytes: 0.04 10*3/uL (ref 0.00–0.07)
Basophils Absolute: 0 10*3/uL (ref 0.0–0.1)
Basophils Relative: 1 %
EOS ABS: 0.1 10*3/uL (ref 0.0–0.5)
Eosinophils Relative: 1 %
HCT: 44.2 % (ref 39.0–52.0)
Hemoglobin: 14.6 g/dL (ref 13.0–17.0)
IMMATURE GRANULOCYTES: 1 %
Lymphocytes Relative: 39 %
Lymphs Abs: 2.6 10*3/uL (ref 0.7–4.0)
MCH: 27.9 pg (ref 26.0–34.0)
MCHC: 33 g/dL (ref 30.0–36.0)
MCV: 84.5 fL (ref 80.0–100.0)
MONO ABS: 0.3 10*3/uL (ref 0.1–1.0)
Monocytes Relative: 5 %
NEUTROS PCT: 53 %
Neutro Abs: 3.5 10*3/uL (ref 1.7–7.7)
Platelets: 258 10*3/uL (ref 150–400)
RBC: 5.23 MIL/uL (ref 4.22–5.81)
RDW: 15.1 % (ref 11.5–15.5)
WBC: 6.6 10*3/uL (ref 4.0–10.5)
nRBC: 0 % (ref 0.0–0.2)

## 2018-05-24 ENCOUNTER — Ambulatory Visit: Payer: 59 | Admitting: Registered Nurse

## 2018-05-24 ENCOUNTER — Encounter: Payer: Self-pay | Admitting: *Deleted

## 2018-05-24 ENCOUNTER — Ambulatory Visit
Admission: RE | Admit: 2018-05-24 | Discharge: 2018-05-24 | Disposition: A | Discharge status: Home / Self Care | Payer: 59 | Attending: Surgery | Admitting: Surgery

## 2018-05-24 ENCOUNTER — Encounter
Admission: RE | Disposition: A | Discharge status: Home / Self Care | Payer: Self-pay | Source: Home / Self Care | Attending: Surgery

## 2018-05-24 DIAGNOSIS — I10 Essential (primary) hypertension: Secondary | ICD-10-CM | POA: Diagnosis not present

## 2018-05-24 DIAGNOSIS — K648 Other hemorrhoids: Secondary | ICD-10-CM | POA: Insufficient documentation

## 2018-05-24 DIAGNOSIS — A630 Anogenital (venereal) warts: Secondary | ICD-10-CM | POA: Insufficient documentation

## 2018-05-24 DIAGNOSIS — E119 Type 2 diabetes mellitus without complications: Secondary | ICD-10-CM | POA: Insufficient documentation

## 2018-05-24 DIAGNOSIS — K620 Anal polyp: Secondary | ICD-10-CM

## 2018-05-24 DIAGNOSIS — Z6841 Body Mass Index (BMI) 40.0 and over, adult: Secondary | ICD-10-CM | POA: Insufficient documentation

## 2018-05-24 DIAGNOSIS — K621 Rectal polyp: Secondary | ICD-10-CM

## 2018-05-24 DIAGNOSIS — Z794 Long term (current) use of insulin: Secondary | ICD-10-CM | POA: Insufficient documentation

## 2018-05-24 DIAGNOSIS — E7800 Pure hypercholesterolemia, unspecified: Secondary | ICD-10-CM | POA: Insufficient documentation

## 2018-05-24 DIAGNOSIS — Z79899 Other long term (current) drug therapy: Secondary | ICD-10-CM | POA: Insufficient documentation

## 2018-05-24 DIAGNOSIS — K635 Polyp of colon: Secondary | ICD-10-CM

## 2018-05-24 HISTORY — PX: RECTAL EXAM UNDER ANESTHESIA: SHX6399

## 2018-05-24 LAB — GLUCOSE, CAPILLARY
GLUCOSE-CAPILLARY: 118 mg/dL — AB (ref 70–99)
GLUCOSE-CAPILLARY: 108 mg/dL — AB (ref 70–99)

## 2018-05-24 SURGERY — EXAM UNDER ANESTHESIA, RECTUM
Anesthesia: General | Site: Rectum

## 2018-05-24 MED ORDER — OXYCODONE-ACETAMINOPHEN 5-325 MG PO TABS: 1.0000 | ORAL_TABLET | ORAL | 0 refills | Status: DC | PRN

## 2018-05-24 MED ORDER — LIDOCAINE HCL (CARDIAC) PF 100 MG/5ML IV SOSY
PREFILLED_SYRINGE | INTRAVENOUS | Status: DC | PRN
  Administered 2018-05-24: 100 mg via INTRAVENOUS

## 2018-05-24 MED ORDER — EPHEDRINE SULFATE 50 MG/ML IJ SOLN
INTRAMUSCULAR | Status: AC
  Filled 2018-05-24: qty 1

## 2018-05-24 MED ORDER — CHLORHEXIDINE GLUCONATE CLOTH 2 % EX PADS: 6.0000 | MEDICATED_PAD | Freq: Once | CUTANEOUS | Status: DC

## 2018-05-24 MED ORDER — GABAPENTIN 300 MG PO CAPS
300.0000 mg | ORAL_CAPSULE | ORAL | Status: AC
  Administered 2018-05-24: 300 mg via ORAL

## 2018-05-24 MED ORDER — SUCCINYLCHOLINE CHLORIDE 20 MG/ML IJ SOLN
INTRAMUSCULAR | Status: AC
  Filled 2018-05-24: qty 1

## 2018-05-24 MED ORDER — ACETAMINOPHEN 500 MG PO TABS
1000.0000 mg | ORAL_TABLET | ORAL | Status: AC
  Administered 2018-05-24: 1000 mg via ORAL

## 2018-05-24 MED ORDER — FAMOTIDINE 20 MG PO TABS
20.0000 mg | ORAL_TABLET | Freq: Once | ORAL | Status: AC
  Administered 2018-05-24: 20 mg via ORAL

## 2018-05-24 MED ORDER — SUCCINYLCHOLINE CHLORIDE 20 MG/ML IJ SOLN
INTRAMUSCULAR | Status: DC | PRN
  Administered 2018-05-24: 120 mg via INTRAVENOUS

## 2018-05-24 MED ORDER — ONDANSETRON HCL 4 MG/2ML IJ SOLN
INTRAMUSCULAR | Status: AC
  Filled 2018-05-24: qty 4

## 2018-05-24 MED ORDER — BUPIVACAINE LIPOSOME 1.3 % IJ SUSP
INTRAMUSCULAR | Status: AC
  Filled 2018-05-24: qty 20

## 2018-05-24 MED ORDER — LIDOCAINE HCL (PF) 2 % IJ SOLN
INTRAMUSCULAR | Status: AC
  Filled 2018-05-24: qty 10

## 2018-05-24 MED ORDER — ONDANSETRON HCL 4 MG/2ML IJ SOLN: 4.0000 mg | Freq: Once | INTRAMUSCULAR | Status: DC | PRN

## 2018-05-24 MED ORDER — BUPIVACAINE LIPOSOME 1.3 % IJ SUSP: 20.0000 mL | Freq: Once | INTRAMUSCULAR | Status: DC

## 2018-05-24 MED ORDER — BUPIVACAINE HCL (PF) 0.25 % IJ SOLN
INTRAMUSCULAR | Status: AC
  Filled 2018-05-24: qty 30

## 2018-05-24 MED ORDER — PHENYLEPHRINE HCL 10 MG/ML IJ SOLN
INTRAMUSCULAR | Status: DC | PRN
  Administered 2018-05-24: 150 ug via INTRAVENOUS
  Administered 2018-05-24 (×2): 100 ug via INTRAVENOUS
  Administered 2018-05-24: 150 ug via INTRAVENOUS
  Administered 2018-05-24: 100 ug via INTRAVENOUS

## 2018-05-24 MED ORDER — PROPOFOL 10 MG/ML IV BOLUS
INTRAVENOUS | Status: AC
  Filled 2018-05-24: qty 40

## 2018-05-24 MED ORDER — FENTANYL CITRATE (PF) 100 MCG/2ML IJ SOLN: 25.0000 ug | INTRAMUSCULAR | Status: DC | PRN

## 2018-05-24 MED ORDER — MIDAZOLAM HCL 2 MG/2ML IJ SOLN
INTRAMUSCULAR | Status: DC | PRN
  Administered 2018-05-24: 2 mg via INTRAVENOUS

## 2018-05-24 MED ORDER — FENTANYL CITRATE (PF) 100 MCG/2ML IJ SOLN
INTRAMUSCULAR | Status: DC | PRN
  Administered 2018-05-24 (×2): 50 ug via INTRAVENOUS

## 2018-05-24 MED ORDER — SUGAMMADEX SODIUM 500 MG/5ML IV SOLN
INTRAVENOUS | Status: DC | PRN
  Administered 2018-05-24: 300 mg via INTRAVENOUS

## 2018-05-24 MED ORDER — LIDOCAINE HCL URETHRAL/MUCOSAL 2 % EX GEL
CUTANEOUS | Status: AC
  Filled 2018-05-24: qty 10

## 2018-05-24 MED ORDER — FAMOTIDINE 20 MG PO TABS
ORAL_TABLET | ORAL | Status: AC
  Administered 2018-05-24: 20 mg via ORAL
  Filled 2018-05-24: qty 1

## 2018-05-24 MED ORDER — ROCURONIUM BROMIDE 50 MG/5ML IV SOLN
INTRAVENOUS | Status: AC
  Filled 2018-05-24: qty 1

## 2018-05-24 MED ORDER — GABAPENTIN 300 MG PO CAPS
ORAL_CAPSULE | ORAL | Status: AC
  Administered 2018-05-24: 300 mg via ORAL
  Filled 2018-05-24: qty 1

## 2018-05-24 MED ORDER — SODIUM CHLORIDE 0.9 % IV SOLN
INTRAVENOUS | Status: DC
  Administered 2018-05-24: 07:00:00 via INTRAVENOUS

## 2018-05-24 MED ORDER — MIDAZOLAM HCL 2 MG/2ML IJ SOLN
INTRAMUSCULAR | Status: AC
  Filled 2018-05-24: qty 2

## 2018-05-24 MED ORDER — GELATIN ABSORBABLE 12-7 MM EX MISC
CUTANEOUS | Status: AC
  Filled 2018-05-24: qty 1

## 2018-05-24 MED ORDER — FLEET ENEMA 7-19 GM/118ML RE ENEM: 1.0000 | ENEMA | Freq: Once | RECTAL | Status: DC

## 2018-05-24 MED ORDER — FENTANYL CITRATE (PF) 100 MCG/2ML IJ SOLN
INTRAMUSCULAR | Status: AC
  Filled 2018-05-24: qty 2

## 2018-05-24 MED ORDER — ACETAMINOPHEN 500 MG PO TABS
ORAL_TABLET | ORAL | Status: AC
  Administered 2018-05-24: 1000 mg via ORAL
  Filled 2018-05-24: qty 2

## 2018-05-24 MED ORDER — PROPOFOL 10 MG/ML IV BOLUS
INTRAVENOUS | Status: DC | PRN
  Administered 2018-05-24: 150 mg via INTRAVENOUS

## 2018-05-24 MED ORDER — METHYLENE BLUE 0.5 % INJ SOLN
INTRAVENOUS | Status: AC
  Filled 2018-05-24: qty 10

## 2018-05-24 MED ORDER — BUPIVACAINE LIPOSOME 1.3 % IJ SUSP
INTRAMUSCULAR | Status: DC | PRN
  Administered 2018-05-24: 10 mL

## 2018-05-24 MED ORDER — ONDANSETRON HCL 4 MG/2ML IJ SOLN
INTRAMUSCULAR | Status: DC | PRN
  Administered 2018-05-24: 4 mg via INTRAVENOUS

## 2018-05-24 MED ORDER — PHENYLEPHRINE HCL 10 MG/ML IJ SOLN
INTRAMUSCULAR | Status: AC
  Filled 2018-05-24: qty 1

## 2018-05-24 MED ORDER — ROCURONIUM BROMIDE 100 MG/10ML IV SOLN
INTRAVENOUS | Status: DC | PRN
  Administered 2018-05-24: 5 mg via INTRAVENOUS

## 2018-05-24 SURGICAL SUPPLY — 37 items
BLADE SURG 15 STRL LF DISP TIS (BLADE) ×2 IMPLANT
BLADE SURG 15 STRL SS (BLADE) ×1
COVER WAND RF STERILE (DRAPES) ×3 IMPLANT
DECANTER SPIKE VIAL GLASS SM (MISCELLANEOUS) ×6 IMPLANT
DRAPE LAPAROTOMY 100X77 ABD (DRAPES) ×3 IMPLANT
DRAPE LAPAROTOMY 77X122 PED (DRAPES) ×3 IMPLANT
DRAPE LEGGINS SURG 28X43 STRL (DRAPES) ×3 IMPLANT
DRAPE PERI LITHO V/GYN (MISCELLANEOUS) ×3 IMPLANT
DRAPE UNDER BUTTOCK W/FLU (DRAPES) ×3 IMPLANT
ELECT CAUTERY BLADE 6.4 (BLADE) ×3 IMPLANT
ELECT REM PT RETURN 9FT ADLT (ELECTROSURGICAL) ×3
ELECTRODE REM PT RTRN 9FT ADLT (ELECTROSURGICAL) ×2 IMPLANT
GAUZE 4X4 16PLY RFD (DISPOSABLE) ×3 IMPLANT
GAUZE SPONGE 4X4 12PLY STRL (GAUZE/BANDAGES/DRESSINGS) ×3 IMPLANT
GLOVE BIO SURGEON STRL SZ7 (GLOVE) ×3 IMPLANT
GLOVE BIOGEL PI IND STRL 7.5 (GLOVE) ×2 IMPLANT
GLOVE BIOGEL PI INDICATOR 7.5 (GLOVE) ×1
GLOVE INDICATOR 7.5 STRL GRN (GLOVE) ×3 IMPLANT
GOWN STRL REUS W/ TWL LRG LVL3 (GOWN DISPOSABLE) ×4 IMPLANT
GOWN STRL REUS W/TWL LRG LVL3 (GOWN DISPOSABLE) ×2
KIT TURNOVER CYSTO (KITS) ×3 IMPLANT
KIT TURNOVER KIT A (KITS) ×3 IMPLANT
LABEL OR SOLS (LABEL) ×3 IMPLANT
LIGASURE VESSEL 5MM BLUNT TIP (ELECTROSURGICAL) IMPLANT
NEEDLE HYPO 25X1 1.5 SAFETY (NEEDLE) ×3 IMPLANT
NS IRRIG 1000ML POUR BTL (IV SOLUTION) ×3 IMPLANT
PACK BASIN MINOR ARMC (MISCELLANEOUS) ×3 IMPLANT
PAD OB MATERNITY 4.3X12.25 (PERSONAL CARE ITEMS) ×3 IMPLANT
PAD PREP 24X41 OB/GYN DISP (PERSONAL CARE ITEMS) ×3 IMPLANT
SCRUB POVIDONE IODINE 4 OZ (MISCELLANEOUS) ×3 IMPLANT
SOL PREP PVP 2OZ (MISCELLANEOUS) ×3
SOLUTION PREP PVP 2OZ (MISCELLANEOUS) ×2 IMPLANT
SUCT SIGMOIDOSCOPE TIP 18 W/TU (SUCTIONS) IMPLANT
SURGILUBE 2OZ TUBE FLIPTOP (MISCELLANEOUS) ×3 IMPLANT
SUT CHROMIC 3 0 SH 27 (SUTURE) IMPLANT
SUT SILK 0 FSL (SUTURE) ×3 IMPLANT
SYR 10ML LL (SYRINGE) ×3 IMPLANT

## 2018-05-24 NOTE — Anesthesia Postprocedure Evaluation (Signed)
Anesthesia Post Note  Patient: Brett Wiley  Procedure(s) Performed: RECTAL EXAM UNDER ANESTHESIA WITH POSSIBLE POLYPECTOMY (N/A Rectum)  Patient location during evaluation: PACU Anesthesia Type: General Level of consciousness: awake and alert Pain management: pain level controlled Vital Signs Assessment: post-procedure vital signs reviewed and stable Respiratory status: spontaneous breathing and respiratory function stable Cardiovascular status: stable Anesthetic complications: no     Last Vitals:  Vitals:   05/24/18 0904 05/24/18 0919  BP: 123/74 118/78  Pulse: 83 79  Resp: (!) 35 (!) 26  Temp: 36.6 C   SpO2: 95% 98%    Last Pain:  Vitals:   05/24/18 0919  TempSrc:   PainSc: Asleep                 Vicktoria Muckey K

## 2018-05-24 NOTE — Discharge Instructions (Addendum)
In addition to included general post-operative instructions for Anal Polypectomy/Hemorrhoidectomy,  Diet: Resume home heart healthy diet.   Activity: No heavy lifting >20 pounds (children, pets, laundry, garbage) or strenuous activity until follow-up, but light activity and walking are encouraged. Do not drive or drink alcohol if taking narcotic pain medications.  Wound care: You may shower and/or bath without restrictions. Sitz baths up to 3x daily may also be utilized for pain relief after surgery.   Medications: Resume all home medications. For mild to moderate pain: acetaminophen (Tylenol) or ibuprofen/naproxen (if no kidney disease). Combining Tylenol with alcohol can substantially increase your risk of causing liver disease. Narcotic pain medications, if prescribed, can be used for severe pain, though may cause nausea, constipation, and drowsiness. Do not combine Tylenol and Percocet (or similar) within a 6 hour period as Percocet (and similar) contain(s) Tylenol. If you do not need the narcotic pain medication, you do not need to fill the prescription.  Call office [phone]) at any time if any questions, worsening pain, fevers/chills, bleeding, drainage from incision site, or other concerns.   AMBULATORY SURGERY  DISCHARGE INSTRUCTIONS   1) The drugs that you were given will stay in your system until tomorrow so for the next 24 hours you should not:  A) Drive an automobile B) Make any legal decisions C) Drink any alcoholic beverage   2) You may resume regular meals tomorrow.  Today it is better to start with liquids and gradually work up to solid foods.  You may eat anything you prefer, but it is better to start with liquids, then soup and crackers, and gradually work up to solid foods.   3) Please notify your doctor immediately if you have any unusual bleeding, trouble breathing, redness and pain at the surgery site, drainage, fever, or pain not relieved by  medication.    4) Additional Instructions:        Please contact your physician with any problems or Same Day Surgery at [phone], Monday through Friday 6 am to 4 pm, or [place] at [facility] number at [phone].

## 2018-05-24 NOTE — Op Note (Signed)
SURGICAL OPERATIVE REPORT   DATE OF PROCEDURE: 05/24/2018  ATTENDING Surgeon(s): Vickie Epley, MD  ANESTHESIA: GETA  PRE-OPERATIVE DIAGNOSIS: Anal polyp(s) not amenable to endoscopic resection (icd-10: K62.0)  POST-OPERATIVE DIAGNOSIS: Anal polyp(s) not amenable to endoscopic resection (icd-10: K62.0)  PROCEDURE(S): (cpt: 62947) 1.) Anoscopy 2.) Polypectomy (anterior), hemorrhoidectomy x2 (Left and posterior internal) with overlying polyp/condyloma  INTRAOPERATIVE FINDINGS: Anal polyp (anterior) and internal hemorrhoids (Left and posterior) with overlying polyp/condyloma  INTRAVENOUS FLUIDS: 700 mL crystalloid   ESTIMATED BLOOD LOSS: Minimal (<20 mL)  URINE OUTPUT: No foley   SPECIMENS: Anal polyp (anterior) and internal hemorrhoids (Left and posterior) with overlying polyp/condyloma  IMPLANTS: None  DRAINS: None   COMPLICATIONS: None apparent   CONDITION AT END OF PROCEDURE: Hemodynamically stable and extubated   DISPOSITION OF PATIENT: PACU  INDICATIONS FOR PROCEDURE:  Patient is a 63 y.o. male who presented for evaluation of an anorectal polyp. Patient reported he underwent screening colonoscopy this past summer (Dr. Vicente Males, 12/18/2017), at which time a 10 mm polypoid lesion was noted in patient's distal rectum/proximal anal canal, which was felt to be not amenable to endoscopic resection. Surgical evaluation and management was advised. Though not specified on colonoscopy, patient also says he's been diagnosed with hemorrhoids and says he occasionally feels an anorectal mass after BM's, but denied peri-anal pain, itching, or burning. He otherwise denied any recent unintentional weight loss, abdominal pain, N/V, or fever/chills. All risks, benefits, and alternatives to above procedure were discussed with the patient, all of patient's questions were answered to his expressed satisfaction, and informed consent was obtained and documented.  DETAILS OF PROCEDURE: Patient  was brought to the operative suite and appropriately identified. General anesthesia was administered, and endotracheal intubation was performed by anesthetist. In high lithotomy position, operative site was prepped and draped in the usual sterile fashion, and following a brief time out, rigid anoscopy was performed with findings of anal polyp (anterior) and internal hemorrhoids (Left and posterior) with overlying polyp/condyloma. Exparel delayed release local anesthetic was injected, and an Alice clamp was placed near the base of each polyp, which was retracted externally. Each retracted polyp/hemorrhoid was carefully dissected from underlying/adherent tissues, and cautery was used to remove the corresponding lesion. The same was repeated for the remaining above polyps/hemorrhoids. Hemostasis was confirmed, additional local anesthetic was injected, and lubricated 4x4" gauze soaked in remaining Exparel was inserted into the anal canal for additional hemostasis and pain control.  Patient was then safely able to be extubated, awakened, and transferred to PACU for post-operative monitoring and care.  I was present for all aspects of the above procedure, and no operative complications were apparent.

## 2018-05-24 NOTE — Transfer of Care (Signed)
Immediate Anesthesia Transfer of Care Note  Patient: Brett Wiley  Procedure(s) Performed: RECTAL EXAM UNDER ANESTHESIA WITH POSSIBLE POLYPECTOMY (N/A Rectum)  Patient Location: PACU  Anesthesia Type:General  Level of Consciousness: sedated  Airway & Oxygen Therapy: Patient Spontanous Breathing and Patient connected to face mask oxygen  Post-op Assessment: Report given to RN and Post -op Vital signs reviewed and stable  Post vital signs: Reviewed and stable  Last Vitals:  Vitals Value Taken Time  BP 123/74 05/24/2018  9:04 AM  Temp 36.6 C 05/24/2018  9:04 AM  Pulse 83 05/24/2018  9:04 AM  Resp 34 05/24/2018  9:04 AM  SpO2 95 % 05/24/2018  9:04 AM  Vitals shown include unvalidated device data.  Last Pain:  Vitals:   05/24/18 0617  TempSrc: Temporal         Complications: No apparent anesthesia complications

## 2018-05-24 NOTE — Anesthesia Preprocedure Evaluation (Signed)
Anesthesia Evaluation  Patient identified by MRN, date of birth, ID band Patient awake    Reviewed: Allergy & Precautions, NPO status , Patient's Chart, lab work & pertinent test results  History of Anesthesia Complications Negative for: history of anesthetic complications  Airway Mallampati: III       Dental   Pulmonary neg sleep apnea, neg COPD,           Cardiovascular hypertension, Pt. on medications (-) Past MI and (-) CHF + dysrhythmias Atrial Fibrillation (-) Valvular Problems/Murmurs     Neuro/Psych neg Seizures    GI/Hepatic Neg liver ROS, neg GERD  ,  Endo/Other  diabetes, Type 2, Insulin DependentMorbid obesity  Renal/GU negative Renal ROS     Musculoskeletal   Abdominal   Peds  Hematology   Anesthesia Other Findings   Reproductive/Obstetrics                            Anesthesia Physical Anesthesia Plan  ASA: III  Anesthesia Plan: General   Post-op Pain Management:    Induction: Intravenous  PONV Risk Score and Plan: 2 and Dexamethasone and Ondansetron  Airway Management Planned: Oral ETT  Additional Equipment:   Intra-op Plan:   Post-operative Plan:   Informed Consent: I have reviewed the patients History and Physical, chart, labs and discussed the procedure including the risks, benefits and alternatives for the proposed anesthesia with the patient or authorized representative who has indicated his/her understanding and acceptance.     Plan Discussed with:   Anesthesia Plan Comments:         Anesthesia Quick Evaluation

## 2018-05-24 NOTE — Anesthesia Procedure Notes (Signed)
Procedure Name: Intubation Date/Time: 05/24/2018 8:03 AM Performed by: Hedda Slade, CRNA Pre-anesthesia Checklist: Patient identified, Patient being monitored, Timeout performed, Emergency Drugs available and Suction available Patient Re-evaluated:Patient Re-evaluated prior to induction Oxygen Delivery Method: Circle system utilized Preoxygenation: Pre-oxygenation with 100% oxygen Induction Type: IV induction Ventilation: Mask ventilation without difficulty Laryngoscope Size: Mac and 4 Grade View: Grade I Tube type: Oral Tube size: 7.5 mm Number of attempts: 1 Airway Equipment and Method: Stylet Placement Confirmation: ETT inserted through vocal cords under direct vision,  positive ETCO2 and breath sounds checked- equal and bilateral Secured at: 22 cm Tube secured with: Tape Dental Injury: Teeth and Oropharynx as per pre-operative assessment

## 2018-05-24 NOTE — Interval H&P Note (Signed)
History and Physical Interval Note:  05/24/2018 7:42 AM  Brett Wiley  has presented today for surgery, with the diagnosis of ANORECTAL POLYP  The various methods of treatment have been discussed with the patient and family. After consideration of risks, benefits and other options for treatment, the patient has consented to  Procedure(s): RECTAL EXAM UNDER ANESTHESIA WITH POSSIBLE POLYPECTOMY (N/A) POSSIBLE HEMORRHOIDECTOMY (N/A) as a surgical intervention .  The patient's history has been reviewed, patient examined, no change in status, stable for surgery.  I have reviewed the patient's chart and labs.  Questions were answered to the patient's satisfaction.     Vickie Epley

## 2018-05-24 NOTE — Anesthesia Post-op Follow-up Note (Signed)
Anesthesia QCDR form completed.        

## 2018-05-24 NOTE — Progress Notes (Signed)
Anorectal packing removed per order. Lourie Retz E 9:51 AM 05/24/2018

## 2018-05-27 ENCOUNTER — Encounter: Payer: Self-pay | Admitting: Surgery

## 2018-05-28 LAB — SURGICAL PATHOLOGY

## 2018-06-03 ENCOUNTER — Ambulatory Visit: Payer: Self-pay | Admitting: Nurse Practitioner

## 2018-06-06 ENCOUNTER — Encounter: Payer: Self-pay | Admitting: Surgery

## 2018-06-06 ENCOUNTER — Ambulatory Visit (INDEPENDENT_AMBULATORY_CARE_PROVIDER_SITE_OTHER): Payer: 59 | Admitting: Surgery

## 2018-06-06 VITALS — BP 107/83 | HR 81 | Temp 97.3°F | Resp 16 | Ht 71.5 in | Wt 299.2 lb

## 2018-06-06 DIAGNOSIS — Z4889 Encounter for other specified surgical aftercare: Secondary | ICD-10-CM

## 2018-06-06 NOTE — Patient Instructions (Signed)
Please call our office if you have questions or concerns.   

## 2018-06-06 NOTE — Progress Notes (Signed)
Surgical Clinic Progress/Follow-up Note   HPI:  62 y.o. Male presents to clinic for post-op follow-up 13 Days s/p anorectal polypectomy/hemorrhoidectomy Rosana Hoes, 05/24/2018). Patient reports he felt "a little sore over the first few days to 1 week", during which time he experienced a few BM's with a small amount of blood, since which his pain and blood with BM's have both resolved completely. He has otherwise been tolerating regular diet with +flatus and normal BM's, denies N/V, fever/chills, CP, or SOB.  Review of Systems:  Constitutional: denies fever/chills  Respiratory: denies shortness of breath, wheezing  Cardiovascular: denies chest pain, palpitations  Gastrointestinal: abdominal pain, N/V, and bowel function as per interval history Skin: Denies any other rashes or skin discolorations  Vital Signs:  BP 107/83   Pulse 81   Temp (!) 97.3 F (36.3 C) (Temporal)   Resp 16   Ht 5' 11.5" (1.816 m)   Wt 299 lb 3.2 oz (135.7 kg)   SpO2 93%   BMI 41.15 kg/m    Physical Exam:  Constitutional:  -- Obese body habitus  -- Awake, alert, and oriented x3  Pulmonary:  -- No crackles -- Equal breath sounds bilaterally -- Breathing non-labored at rest Cardiovascular:  -- S1, S2 present  -- No pericardial rubs  Gastrointestinal:  -- Soft and non-distended, non-tender to palpation, no guarding/rebound tenderness -- No abdominal masses appreciated, pulsatile or otherwise  Anorectal: -- Non-tender to palpation with no gross blood -- Anal sphincter tone WNL, no fissure or fistula -- Currently no prolapsed hemorrhoid(s) Musculoskeletal / Integumentary:  -- Wounds or skin discoloration: None appreciated -- Extremities: B/L UE and LE FROM, hands and feet warm, no edema   Imaging: No new pertinent imaging available for review  Assessment:  62 y.o. yo Male with a problem list including...  Patient Active Problem List   Diagnosis Date Noted  . Cellulitis of toe of left foot 03/06/2018   . Encounter for screening colonoscopy 11/27/2017  . Uncontrolled type 2 diabetes mellitus with hyperglycemia ([place]) 08/29/2017  . Shortness of breath 08/29/2017  . Testicular hypofunction 08/29/2017  . Bilateral leg edema 02/12/2017  . Uncontrolled type 2 diabetes mellitus with hypoglycemia ([place]) 02/11/2017  . Diabetic foot infection ([place]) 02/11/2017  . Essential hypertension 02/11/2017  . Bell palsy 04/22/2015    presents to clinic for post-op follow-up evaluation, doing well 13 Days s/p anorectal polypectomy/hemorrhoidectomy Rosana Hoes, 05/24/2018).  Plan:   - discussed with patient pathology results  - diet, activities, and shower/baths without restrictions  - instructed to call office if any questions or concerns             - return to clinic as needed  All of the above recommendations were discussed with the patient, and all of patient's questions were answered to his expressed satisfaction.  -- Marilynne Drivers Rosana Hoes, MD, [place]: [facility] - Partnering for exceptional care. Office: [phone]

## 2018-06-10 DIAGNOSIS — K620 Anal polyp: Secondary | ICD-10-CM

## 2018-06-10 DIAGNOSIS — K621 Rectal polyp: Secondary | ICD-10-CM

## 2018-06-24 ENCOUNTER — Other Ambulatory Visit: Payer: Self-pay

## 2018-06-24 MED ORDER — LISINOPRIL 2.5 MG PO TABS: 2.5000 mg | ORAL_TABLET | Freq: Every day | ORAL | 3 refills | Status: DC

## 2018-08-26 ENCOUNTER — Other Ambulatory Visit: Payer: Self-pay

## 2018-08-26 MED ORDER — DILTIAZEM HCL ER COATED BEADS 240 MG PO CP24: 240.0000 mg | ORAL_CAPSULE | ORAL | 5 refills | Status: DC

## 2018-11-11 ENCOUNTER — Other Ambulatory Visit: Payer: Self-pay

## 2018-11-11 MED ORDER — LISINOPRIL 2.5 MG PO TABS: 2.5000 mg | ORAL_TABLET | Freq: Every day | ORAL | 3 refills | Status: DC

## 2018-11-12 ENCOUNTER — Encounter: Payer: Self-pay | Admitting: Nurse Practitioner

## 2018-11-12 ENCOUNTER — Ambulatory Visit (INDEPENDENT_AMBULATORY_CARE_PROVIDER_SITE_OTHER): Payer: 59 | Admitting: Nurse Practitioner

## 2018-11-12 ENCOUNTER — Other Ambulatory Visit: Payer: Self-pay

## 2018-11-12 VITALS — Ht 71.5 in | Wt 295.0 lb

## 2018-11-12 DIAGNOSIS — I10 Essential (primary) hypertension: Secondary | ICD-10-CM | POA: Diagnosis not present

## 2018-11-12 DIAGNOSIS — E1165 Type 2 diabetes mellitus with hyperglycemia: Secondary | ICD-10-CM | POA: Diagnosis not present

## 2018-11-12 DIAGNOSIS — E7800 Pure hypercholesterolemia, unspecified: Secondary | ICD-10-CM | POA: Diagnosis not present

## 2018-11-12 MED ORDER — LISINOPRIL 2.5 MG PO TABS: 2.5000 mg | ORAL_TABLET | Freq: Every day | ORAL | 5 refills | Status: DC

## 2018-11-12 NOTE — Progress Notes (Signed)
[facility] [place], [place] [address]  Internal MEDICINE  Telephone Visit  Patient Name: Brett Wiley  [id number]  [id number]  Date of Service: 11/20/2018  I connected with the patient at 3:20pm by webcam and verified the patients identity using two identifiers.   I discussed the limitations, risks, security and privacy concerns of performing an evaluation and management service by webcam and the availability of in person appointments. I also discussed with the patient that there may be a patient responsible charge related to the service.  The patient expressed understanding and agrees to proceed.    Chief Complaint  Patient presents with  . Telephone Screen    VIDEO VISIT [phone]  . Telephone Assessment  . Medical Management of Chronic Issues    follow up medication refills  . Hypertension  . Hyperlipidemia  . Diabetes    The patient has been contacted via webcam for follow up visit due to concerns for spread of novel coronavirus. He states that he is doing well. Blood sugars got high for a little while when quarantined. States that medication did not get refilled as needed, however, he has restarted the medication. Now that he is back on it, ugars are returning to normal. Feels good. Blood pressure doing well. No concerns or complaints today. Will check HgbA1c at his next visit.       Current Medication: Outpatient Encounter Medications as of 11/12/2018  Medication Sig Note  . diltiazem (CARTIA XT) 240 MG 24 hr capsule Take 1 capsule (240 mg total) by mouth every morning.   Marland Kitchen HUMALOG KWIKPEN 100 UNIT/ML KiwkPen 10 to 20 units with meal per sliding scale (Patient taking differently: Inject 2-4 Units into the skin 3 (three) times daily as needed (only if blood sugar is above 100). with meal per sliding scale as needed)   . Insulin Glargine (BASAGLAR KWIKPEN) 100 UNIT/ML SOPN Inject up to 50 units every evening (Patient taking differently: Inject 40  Units into the skin at bedtime. Inject up to 40 units every evening)   . lisinopril (ZESTRIL) 2.5 MG tablet Take 1 tablet (2.5 mg total) by mouth daily.   Marland Kitchen lovastatin (MEVACOR) 20 MG tablet Take 1 tablet (20 mg total) by mouth at bedtime.   . Semaglutide,0.25 or 0.5MG /DOS, (OZEMPIC, 0.25 OR 0.5 MG/DOSE,) 2 MG/1.5ML SOPN Inject 0.5 mg into the skin once a week. ON WEDNESDAYS   . [DISCONTINUED] liraglutide (VICTOZA) 18 MG/3ML SOPN Inject 3 mg into the skin once a week. Wednesdays ; pt says he injects 0.5 ml weekly 11/20/2018: patient on ozempic 0.5mg  weekly   . [DISCONTINUED] lisinopril (ZESTRIL) 2.5 MG tablet Take 1 tablet (2.5 mg total) by mouth daily.    No facility-administered encounter medications on file as of 11/12/2018.     Surgical History: Past Surgical History:  Procedure Laterality Date  . COLONOSCOPY WITH PROPOFOL N/A 12/18/2017   Procedure: COLONOSCOPY WITH PROPOFOL;  Surgeon: Jonathon Bellows, MD;  Location: [facility] ENDOSCOPY;  Service: Gastroenterology;  Laterality: N/A;  . RECTAL EXAM UNDER ANESTHESIA N/A 05/24/2018   Procedure: RECTAL EXAM UNDER ANESTHESIA WITH POSSIBLE POLYPECTOMY;  Surgeon: Vickie Epley, MD;  Location: [facility] ORS;  Service: General;  Laterality: N/A;    Medical History: Past Medical History:  Diagnosis Date  . Arthritis   . Atrial fibrillation ([place]) 1993  . Diabetes mellitus without complication ([place])   . History of kidney stones   . Hypercholesteremia   . Hypertension     Family History:  Family History  Problem Relation Age of Onset  . Diabetes Mother   . Diabetes Father   . Hypertension Father     Social History   Socioeconomic History  . Marital status: Single    Spouse name: Not on file  . Number of children: Not on file  . Years of education: Not on file  . Highest education level: Not on file  Occupational History  . Not on file  Social Needs  . Financial resource strain: Not on file  . Food insecurity:    Worry: Not on file     Inability: Not on file  . Transportation needs:    Medical: Not on file    Non-medical: Not on file  Tobacco Use  . Smoking status: Never Smoker  . Smokeless tobacco: Never Used  Substance and Sexual Activity  . Alcohol use: No    Alcohol/week: 0.0 standard drinks  . Drug use: No  . Sexual activity: Not on file  Lifestyle  . Physical activity:    Days per week: Not on file    Minutes per session: Not on file  . Stress: Not on file  Relationships  . Social connections:    Talks on phone: Not on file    Gets together: Not on file    Attends religious service: Not on file    Active member of club or organization: Not on file    Attends meetings of clubs or organizations: Not on file    Relationship status: Not on file  . Intimate partner violence:    Fear of current or ex partner: Not on file    Emotionally abused: Not on file    Physically abused: Not on file    Forced sexual activity: Not on file  Other Topics Concern  . Not on file  Social History Narrative  . Not on file      Review of Systems  Constitutional: Negative for chills, fatigue and unexpected weight change.  HENT: Negative for congestion, rhinorrhea, sneezing and sore throat.   Respiratory: Negative for cough, chest tightness, shortness of breath and wheezing.   Cardiovascular: Negative for chest pain and palpitations.  Gastrointestinal: Negative for abdominal pain, constipation, diarrhea, nausea and vomiting.  Endocrine: Negative for cold intolerance, heat intolerance, polydipsia and polyuria.       Blood sugars doing well   Musculoskeletal: Negative for arthralgias, back pain, joint swelling and neck pain.  Skin: Negative for rash.  Allergic/Immunologic: Negative for environmental allergies.  Neurological: Negative for tremors, numbness and headaches.  Hematological: Negative for adenopathy. Does not bruise/bleed easily.  Psychiatric/Behavioral: Negative for behavioral problems, sleep disturbance and  suicidal ideas. The patient is not nervous/anxious.     Today's Vitals   11/12/18 1459  Weight: 295 lb (133.8 kg)  Height: 5' 11.5" (1.816 m)   Body mass index is 40.57 kg/m.  Observation/Objective:  The patient is alert and oriented. He is pleasant and answering all questions appropriately. Breathing is non-labored. He is in no acute distress.    Assessment/Plan: 1. Essential hypertension Overall, stable. Continue bp medication as prescribed  - lisinopril (ZESTRIL) 2.5 MG tablet; Take 1 tablet (2.5 mg total) by mouth daily.  Dispense: 30 tablet; Refill: 5  2. Uncontrolled type 2 diabetes mellitus with hyperglycemia ([place]) Well managed. Continue all diabetic medication as prescribed. No changes made today.  3. Hypercholesteremia Continue lovastatin as prescribed   General Counseling: Rook verbalizes understanding of the findings of today's phone visit and agrees  with plan of treatment. I have discussed any further diagnostic evaluation that may be needed or ordered today. We also reviewed his medications today. he has been encouraged to call the office with any questions or concerns that should arise related to todays visit.   Diabetes Counseling:  1. Addition of ACE inh/ ARB'S for nephroprotection. Microalbumin is updated  2. Diabetic foot care, prevention of complications. Podiatry consult 3. Exercise and lose weight.  4. Diabetic eye examination, Diabetic eye exam is updated  5. Monitor blood sugar closlely. nutrition counseling.  6. Sign and symptoms of hypoglycemia including shaking sweating,confusion and headaches.  This patient was seen by [place] with Dr Lavera Guise as a part of collaborative care agreement  Meds ordered this encounter  Medications  . lisinopril (ZESTRIL) 2.5 MG tablet    Sig: Take 1 tablet (2.5 mg total) by mouth daily.    Dispense:  30 tablet    Refill:  5    Order Specific Question:   Supervising Provider     Answer:   Lavera Guise [[id number]]    Time spent: 24 Minutes    Dr Lavera Guise Internal medicine

## 2018-11-20 DIAGNOSIS — E7800 Pure hypercholesterolemia, unspecified: Secondary | ICD-10-CM | POA: Insufficient documentation

## 2018-11-20 DIAGNOSIS — E1169 Type 2 diabetes mellitus with other specified complication: Secondary | ICD-10-CM | POA: Insufficient documentation

## 2018-12-05 ENCOUNTER — Other Ambulatory Visit: Payer: Self-pay

## 2018-12-05 MED ORDER — SILDENAFIL CITRATE 20 MG PO TABS: 20.0000 mg | ORAL_TABLET | Freq: Every day | ORAL | 0 refills | Status: DC | PRN

## 2018-12-09 ENCOUNTER — Other Ambulatory Visit: Payer: Self-pay

## 2018-12-09 DIAGNOSIS — E1165 Type 2 diabetes mellitus with hyperglycemia: Secondary | ICD-10-CM

## 2018-12-09 MED ORDER — LOVASTATIN 20 MG PO TABS: 20.0000 mg | ORAL_TABLET | Freq: Every day | ORAL | 1 refills | Status: DC

## 2018-12-09 MED ORDER — BASAGLAR KWIKPEN 100 UNIT/ML ~~LOC~~ SOPN: PEN_INJECTOR | SUBCUTANEOUS | 3 refills | Status: DC

## 2018-12-24 ENCOUNTER — Ambulatory Visit: Payer: 59 | Admitting: Nurse Practitioner

## 2018-12-24 ENCOUNTER — Other Ambulatory Visit: Payer: Self-pay

## 2018-12-24 ENCOUNTER — Encounter: Payer: Self-pay | Admitting: Nurse Practitioner

## 2018-12-24 VITALS — BP 118/77 | HR 93 | Resp 16 | Ht 71.0 in | Wt 307.0 lb

## 2018-12-24 DIAGNOSIS — E1165 Type 2 diabetes mellitus with hyperglycemia: Secondary | ICD-10-CM

## 2018-12-24 DIAGNOSIS — E7800 Pure hypercholesterolemia, unspecified: Secondary | ICD-10-CM

## 2018-12-24 DIAGNOSIS — I10 Essential (primary) hypertension: Secondary | ICD-10-CM | POA: Diagnosis not present

## 2018-12-24 LAB — POCT GLYCOSYLATED HEMOGLOBIN (HGB A1C): Hemoglobin A1C: 7.6 % — AB (ref 4.0–5.6)

## 2018-12-24 MED ORDER — OZEMPIC (0.25 OR 0.5 MG/DOSE) 2 MG/1.5ML ~~LOC~~ SOPN: 0.5000 mg | PEN_INJECTOR | SUBCUTANEOUS | 5 refills | Status: DC

## 2018-12-24 NOTE — Progress Notes (Signed)
[facility] [place], [place] [address]  Internal MEDICINE  Office Visit Note  Patient Name: Brett Wiley  [id number]  [id number]  Date of Service: 01/01/2019  Chief Complaint  Patient presents with  . Hypertension  . Diabetes  . Medication Refill    ozempic    The patient is here for routine follow up of diabetes. He had been out of ozempic for some time. Blood sugars are elevated. HgbA1c is 7.6, up from 6.6 at his last check. He states that he feels well. He has no headaches or blurry vision. Breathing is good. Has no chest pain, chest pressure, or shortness of breath. He does need a new prescription for his ozempic tday.       Current Medication: Outpatient Encounter Medications as of 12/24/2018  Medication Sig  . diltiazem (CARTIA XT) 240 MG 24 hr capsule Take 1 capsule (240 mg total) by mouth every morning.  Marland Kitchen HUMALOG KWIKPEN 100 UNIT/ML KiwkPen 10 to 20 units with meal per sliding scale (Patient taking differently: Inject 2-4 Units into the skin 3 (three) times daily as needed (only if blood sugar is above 100). with meal per sliding scale as needed)  . Insulin Glargine (BASAGLAR KWIKPEN) 100 UNIT/ML SOPN Inject up to 50 units every evening  . lisinopril (ZESTRIL) 2.5 MG tablet Take 1 tablet (2.5 mg total) by mouth daily.  Marland Kitchen lovastatin (MEVACOR) 20 MG tablet Take 1 tablet (20 mg total) by mouth at bedtime.  . Semaglutide,0.25 or 0.5MG /DOS, (OZEMPIC, 0.25 OR 0.5 MG/DOSE,) 2 MG/1.5ML SOPN Inject 0.5 mg into the skin once a week. ON WEDNESDAYS  . sildenafil (REVATIO) 20 MG tablet Take 1 tablet (20 mg total) by mouth daily as needed.  . [DISCONTINUED] Semaglutide,0.25 or 0.5MG /DOS, (OZEMPIC, 0.25 OR 0.5 MG/DOSE,) 2 MG/1.5ML SOPN Inject 0.5 mg into the skin once a week. ON WEDNESDAYS   No facility-administered encounter medications on file as of 12/24/2018.     Surgical History: Past Surgical History:  Procedure Laterality Date  . COLONOSCOPY WITH  PROPOFOL N/A 12/18/2017   Procedure: COLONOSCOPY WITH PROPOFOL;  Surgeon: Jonathon Bellows, MD;  Location: [facility] ENDOSCOPY;  Service: Gastroenterology;  Laterality: N/A;  . RECTAL EXAM UNDER ANESTHESIA N/A 05/24/2018   Procedure: RECTAL EXAM UNDER ANESTHESIA WITH POSSIBLE POLYPECTOMY;  Surgeon: Vickie Epley, MD;  Location: [facility] ORS;  Service: General;  Laterality: N/A;    Medical History: Past Medical History:  Diagnosis Date  . Arthritis   . Atrial fibrillation ([place]) 1993  . Diabetes mellitus without complication ([place])   . History of kidney stones   . Hypercholesteremia   . Hypertension     Family History: Family History  Problem Relation Age of Onset  . Diabetes Mother   . Diabetes Father   . Hypertension Father     Social History   Socioeconomic History  . Marital status: Single    Spouse name: Not on file  . Number of children: Not on file  . Years of education: Not on file  . Highest education level: Not on file  Occupational History  . Not on file  Social Needs  . Financial resource strain: Not on file  . Food insecurity    Worry: Not on file    Inability: Not on file  . Transportation needs    Medical: Not on file    Non-medical: Not on file  Tobacco Use  . Smoking status: Never Smoker  . Smokeless tobacco: Never Used  Substance  and Sexual Activity  . Alcohol use: No    Alcohol/week: 0.0 standard drinks  . Drug use: No  . Sexual activity: Not on file  Lifestyle  . Physical activity    Days per week: Not on file    Minutes per session: Not on file  . Stress: Not on file  Relationships  . Social Herbalist on phone: Not on file    Gets together: Not on file    Attends religious service: Not on file    Active member of club or organization: Not on file    Attends meetings of clubs or organizations: Not on file    Relationship status: Not on file  . Intimate partner violence    Fear of current or ex partner: Not on file    Emotionally  abused: Not on file    Physically abused: Not on file    Forced sexual activity: Not on file  Other Topics Concern  . Not on file  Social History Narrative  . Not on file      Review of Systems  Constitutional: Negative for activity change, fatigue and unexpected weight change.  HENT: Negative for congestion, postnasal drip, sinus pressure and sneezing.   Respiratory: Negative for chest tightness, shortness of breath and wheezing.   Cardiovascular: Negative for chest pain and palpitations.       Improved since hs last visit.  Gastrointestinal: Negative for constipation, diarrhea, nausea and vomiting.  Endocrine: Negative for cold intolerance, heat intolerance, polydipsia and polyuria.       Blood sugars elevated over past few weeks. Has been out of ozempic.   Musculoskeletal: Negative for arthralgias and back pain.  Skin: Negative for rash.  Allergic/Immunologic: Negative for environmental allergies.  Neurological: Negative for dizziness, weakness and headaches.  Hematological: Negative for adenopathy.  Psychiatric/Behavioral: Negative for behavioral problems. The patient is not nervous/anxious.     Today's Vitals   12/24/18 1557  BP: 118/77  Pulse: 93  Resp: 16  SpO2: 93%  Weight: (!) 307 lb (139.3 kg)  Height: 5\' 11"  (1.803 m)   Body mass index is 42.82 kg/m.  Physical Exam Vitals signs and nursing note reviewed.  Constitutional:      General: He is not in acute distress.    Appearance: Normal appearance. He is well-developed. He is not diaphoretic.  HENT:     Head: Normocephalic and atraumatic.     Nose: Nose normal.     Mouth/Throat:     Pharynx: No oropharyngeal exudate.  Eyes:     Conjunctiva/sclera: Conjunctivae normal.     Pupils: Pupils are equal, round, and reactive to light.  Neck:     Musculoskeletal: Normal range of motion and neck supple.     Thyroid: No thyromegaly.     Vascular: No carotid bruit or JVD.     Trachea: No tracheal deviation.   Cardiovascular:     Rate and Rhythm: Normal rate and regular rhythm.     Heart sounds: Normal heart sounds. No murmur. No friction rub. No gallop.   Pulmonary:     Effort: Pulmonary effort is normal. No respiratory distress.     Breath sounds: Normal breath sounds. No wheezing or rales.  Chest:     Chest wall: No tenderness.  Abdominal:     General: Bowel sounds are normal.     Palpations: Abdomen is soft.     Tenderness: There is no abdominal tenderness.  Musculoskeletal: Normal range of motion.  Lymphadenopathy:     Cervical: No cervical adenopathy.  Skin:    General: Skin is warm and dry.     Capillary Refill: Capillary refill takes less than 2 seconds.     Comments: Left foot is red, swollen, and very tender. Warm to touch. difficut to move toes and foot as the pain is so severe.   Neurological:     Mental Status: He is alert and oriented to person, place, and time.     Cranial Nerves: No cranial nerve deficit.  Psychiatric:        Behavior: Behavior normal.        Thought Content: Thought content normal.        Judgment: Judgment normal.    Assessment/Plan: 1. Type 2 diabetes mellitus with hyperglycemia, unspecified whether Hosman term insulin use (HCC) - POCT HgB A1C 7.6 today. Restart ozempic 0.5mg  weekly. Continue bazaglar as prescribed. Careful control of carbohydrates and sugar in the diet. Incorporate exercise into daily routine.  - Semaglutide,0.25 or 0.5MG /DOS, (OZEMPIC, 0.25 OR 0.5 MG/DOSE,) 2 MG/1.5ML SOPN; Inject 0.5 mg into the skin once a week. ON WEDNESDAYS  Dispense: 1 pen; Refill: 5  2. Essential hypertension Stable. Continue BP medication as prescribed   3. Hypercholesteremia Continue lovastatin as prescribed   General Counseling: Fern verbalizes understanding of the findings of todays visit and agrees with plan of treatment. I have discussed any further diagnostic evaluation that may be needed or ordered today. We also reviewed his medications today.  he has been encouraged to call the office with any questions or concerns that should arise related to todays visit.  Diabetes Counseling:  1. Addition of ACE inh/ ARB'S for nephroprotection. Microalbumin is updated  2. Diabetic foot care, prevention of complications. Podiatry consult 3. Exercise and lose weight.  4. Diabetic eye examination, Diabetic eye exam is updated  5. Monitor blood sugar closlely. nutrition counseling.  6. Sign and symptoms of hypoglycemia including shaking sweating,confusion and headaches.  This patient was seen by Leretha Pol FNP Collaboration with Dr Lavera Guise as a part of collaborative care agreement  Orders Placed This Encounter  Procedures  . POCT HgB A1C    Meds ordered this encounter  Medications  . Semaglutide,0.25 or 0.5MG /DOS, (OZEMPIC, 0.25 OR 0.5 MG/DOSE,) 2 MG/1.5ML SOPN    Sig: Inject 0.5 mg into the skin once a week. ON WEDNESDAYS    Dispense:  1 pen    Refill:  5    Order Specific Question:   Supervising Provider    Answer:   Lavera Guise [[id number]]    Time spent: 34 Minutes      Dr Lavera Guise Internal medicine

## 2019-01-01 DIAGNOSIS — E1165 Type 2 diabetes mellitus with hyperglycemia: Secondary | ICD-10-CM | POA: Insufficient documentation

## 2019-01-13 ENCOUNTER — Other Ambulatory Visit: Payer: Self-pay

## 2019-01-13 MED ORDER — HUMALOG KWIKPEN 100 UNIT/ML ~~LOC~~ SOPN: PEN_INJECTOR | SUBCUTANEOUS | 5 refills | Status: DC

## 2019-01-17 ENCOUNTER — Other Ambulatory Visit: Payer: Self-pay

## 2019-01-17 MED ORDER — HUMALOG KWIKPEN 100 UNIT/ML ~~LOC~~ SOPN: PEN_INJECTOR | SUBCUTANEOUS | 1 refills | Status: DC

## 2019-03-25 ENCOUNTER — Other Ambulatory Visit: Payer: Self-pay

## 2019-03-25 MED ORDER — DILTIAZEM HCL ER COATED BEADS 240 MG PO CP24: 240.0000 mg | ORAL_CAPSULE | ORAL | 5 refills | Status: DC

## 2019-03-27 ENCOUNTER — Encounter: Payer: 59 | Admitting: Nurse Practitioner

## 2019-04-17 ENCOUNTER — Other Ambulatory Visit: Payer: Self-pay

## 2019-04-17 ENCOUNTER — Other Ambulatory Visit: Payer: Self-pay | Admitting: Nurse Practitioner

## 2019-04-17 MED ORDER — HUMALOG KWIKPEN 100 UNIT/ML ~~LOC~~ SOPN: PEN_INJECTOR | SUBCUTANEOUS | 1 refills | Status: DC

## 2019-04-22 ENCOUNTER — Telehealth: Payer: Self-pay

## 2019-04-22 NOTE — Telephone Encounter (Signed)
CONFIRMED 04-24-19 APPOINTMENT AND SCREENED PATIENT.

## 2019-04-24 ENCOUNTER — Encounter: Payer: Self-pay | Admitting: Nurse Practitioner

## 2019-04-24 ENCOUNTER — Other Ambulatory Visit: Payer: Self-pay

## 2019-04-24 ENCOUNTER — Ambulatory Visit (INDEPENDENT_AMBULATORY_CARE_PROVIDER_SITE_OTHER): Payer: 59 | Admitting: Nurse Practitioner

## 2019-04-24 VITALS — BP 134/82 | HR 80 | Temp 96.7°F | Resp 16 | Ht 71.0 in | Wt 317.0 lb

## 2019-04-24 DIAGNOSIS — E1165 Type 2 diabetes mellitus with hyperglycemia: Secondary | ICD-10-CM

## 2019-04-24 DIAGNOSIS — R300 Dysuria: Secondary | ICD-10-CM | POA: Diagnosis not present

## 2019-04-24 DIAGNOSIS — Z794 Long term (current) use of insulin: Secondary | ICD-10-CM

## 2019-04-24 DIAGNOSIS — Z0001 Encounter for general adult medical examination with abnormal findings: Secondary | ICD-10-CM

## 2019-04-24 DIAGNOSIS — I10 Essential (primary) hypertension: Secondary | ICD-10-CM

## 2019-04-24 LAB — POCT GLYCOSYLATED HEMOGLOBIN (HGB A1C): Hemoglobin A1C: 7.4 % — AB (ref 4.0–5.6)

## 2019-04-24 MED ORDER — PEN NEEDLES 30G X 5 MM MISC: 1.0000 | Freq: Four times a day (QID) | 5 refills | Status: DC | PRN

## 2019-04-24 NOTE — Progress Notes (Signed)
[facility], [place] [address]  Internal MEDICINE  Office Visit Note  Patient Name: Brett Wiley  [id number]  [id number]  Date of Service: 04/24/2019   Pt is here for routine health maintenance examination  Chief Complaint  Patient presents with  . Annual Exam  . Diabetes  . Hypertension  . Hyperlipidemia  . Quality Metric Gaps    diabetic eye and foot exam   . Back Pain    ocassional back pain      The patient is here for health maintenance exam. Blood sugars doing some better since his last visit. HgbA1c is 7.4, down from 7.6 at the most recent check. Does have some low back pain. States that he lifted a heavy box last week, and back has bothered him ever since. Feels ok in the mornings, but gets worse as time goes. Has made a lot of diet changes. Has essentially cut out soda and bread from his diet. Will, sometimes, have half and half tea. Eating more broiled seafood and chicken and fresh vegetables. He is due to have routine, fasting labs. He has slip, but due to expense, has been holding off on this. He is also asking to postpone diabetic eye exam until he is more settled financially.     Current Medication: Outpatient Encounter Medications as of 04/24/2019  Medication Sig  . diltiazem (CARTIA XT) 240 MG 24 hr capsule Take 1 capsule (240 mg total) by mouth every morning.  Marland Kitchen HUMALOG KWIKPEN 100 UNIT/ML KwikPen 2-4 Units Sub 3 times daily PRN, max unit 14 units daily  only if blood sugar is above 100, with meal per sliding scale  . Insulin Glargine (BASAGLAR KWIKPEN) 100 UNIT/ML SOPN Inject up to 50 units every evening  . lisinopril (ZESTRIL) 2.5 MG tablet Take 1 tablet (2.5 mg total) by mouth daily.  Marland Kitchen lovastatin (MEVACOR) 20 MG tablet Take 1 tablet (20 mg total) by mouth at bedtime.  . Semaglutide,0.25 or 0.5MG /DOS, (OZEMPIC, 0.25 OR 0.5 MG/DOSE,) 2 MG/1.5ML SOPN Inject 0.5 mg into the skin once a week. ON WEDNESDAYS  . sildenafil  (REVATIO) 20 MG tablet Take 1 tablet (20 mg total) by mouth daily as needed.  . Insulin Pen Needle (PEN NEEDLES) 30G X 5 MM MISC 1 each by Does not apply route 4 (four) times daily as needed. E11.65   No facility-administered encounter medications on file as of 04/24/2019.     Surgical History: Past Surgical History:  Procedure Laterality Date  . COLONOSCOPY WITH PROPOFOL N/A 12/18/2017   Procedure: COLONOSCOPY WITH PROPOFOL;  Surgeon: Jonathon Bellows, MD;  Location: [facility] ENDOSCOPY;  Service: Gastroenterology;  Laterality: N/A;  . RECTAL EXAM UNDER ANESTHESIA N/A 05/24/2018   Procedure: RECTAL EXAM UNDER ANESTHESIA WITH POSSIBLE POLYPECTOMY;  Surgeon: Vickie Epley, MD;  Location: [facility] ORS;  Service: General;  Laterality: N/A;    Medical History: Past Medical History:  Diagnosis Date  . Arthritis   . Atrial fibrillation ([place]) 1993  . Diabetes mellitus without complication ([place])   . History of kidney stones   . Hypercholesteremia   . Hypertension     Family History: Family History  Problem Relation Age of Onset  . Diabetes Mother   . Diabetes Father   . Hypertension Father       Review of Systems  Constitutional: Negative for chills, fatigue and unexpected weight change.       Weight gain 12 pounds since last visit.   HENT: Negative for  congestion, postnasal drip, rhinorrhea, sneezing and sore throat.   Respiratory: Negative for cough, chest tightness, shortness of breath and wheezing.   Cardiovascular: Negative for chest pain and palpitations.  Gastrointestinal: Negative for abdominal pain, constipation, diarrhea, nausea and vomiting.  Endocrine: Negative for cold intolerance, heat intolerance, polydipsia and polyuria.       Blood sugars improving some since his last visit .  Genitourinary: Negative for dysuria, frequency, hematuria and urgency.  Musculoskeletal: Positive for back pain. Negative for arthralgias, joint swelling and neck pain.       Intermittent.   Skin:  Negative for rash.  Neurological: Negative for dizziness, tremors, numbness and headaches.  Hematological: Negative for adenopathy. Does not bruise/bleed easily.  Psychiatric/Behavioral: Negative for behavioral problems (Depression), sleep disturbance and suicidal ideas. The patient is not nervous/anxious.      Today's Vitals   04/24/19 1013  BP: 134/82  Pulse: 80  Resp: 16  Temp: (!) 96.7 F (35.9 C)  SpO2: 97%  Weight: (!) 317 lb (143.8 kg)  Height: 5\' 11"  (1.803 m)   Body mass index is 44.21 kg/m.  Physical Exam Vitals signs and nursing note reviewed.  Constitutional:      General: He is not in acute distress.    Appearance: Normal appearance. He is well-developed. He is not diaphoretic.  HENT:     Head: Normocephalic and atraumatic.     Nose: Nose normal.     Mouth/Throat:     Pharynx: No oropharyngeal exudate.  Eyes:     Pupils: Pupils are equal, round, and reactive to light.  Neck:     Musculoskeletal: Normal range of motion and neck supple.     Thyroid: No thyromegaly.     Vascular: No JVD.     Trachea: No tracheal deviation.  Cardiovascular:     Rate and Rhythm: Normal rate and regular rhythm.     Pulses:          Dorsalis pedis pulses are 2+ on the right side and 2+ on the left side.       Posterior tibial pulses are 2+ on the right side and 2+ on the left side.     Heart sounds: Normal heart sounds. No murmur. No friction rub. No gallop.   Pulmonary:     Effort: Pulmonary effort is normal. No respiratory distress.     Breath sounds: Normal breath sounds. No wheezing or rales.  Chest:     Chest wall: No tenderness.  Abdominal:     General: Bowel sounds are normal.     Palpations: Abdomen is soft.     Tenderness: There is no abdominal tenderness.  Musculoskeletal: Normal range of motion.     Right foot: Normal range of motion. No deformity.     Left foot: Normal range of motion. No deformity.  Feet:     Right foot:     Protective Sensation: 10 sites  tested. 10 sites sensed.     Skin integrity: Skin integrity normal.     Toenail Condition: Right toenails are normal.     Left foot:     Protective Sensation: 10 sites tested. 10 sites sensed.     Skin integrity: Skin integrity normal.     Toenail Condition: Left toenails are normal.  Lymphadenopathy:     Cervical: No cervical adenopathy.  Skin:    General: Skin is warm and dry.  Neurological:     Mental Status: He is alert and oriented to person, place, and time.  Cranial Nerves: No cranial nerve deficit.  Psychiatric:        Mood and Affect: Mood normal.        Behavior: Behavior normal.        Thought Content: Thought content normal.        Judgment: Judgment normal.      LABS: Recent Results (from the past 2160 hour(s))  POCT HgB A1C     Status: Abnormal   Collection Time: 04/24/19 10:25 AM  Result Value Ref Range   Hemoglobin A1C 7.4 (A) 4.0 - 5.6 %   HbA1c POC (<> result, manual entry)     HbA1c, POC (prediabetic range)     HbA1c, POC (controlled diabetic range)      Assessment/Plan: 1. Encounter for general adult medical examination with abnormal findings Annual health maintenance exam today.   2. Type 2 diabetes mellitus with hyperglycemia, with [place]-term current use of insulin (HCC) - POCT HgB A1C 7.4 today. Improved from 7.6 at most recent check. Continue medications as prescribed. Discussed increasing ozempic to 1mg  weekly. - Insulin Pen Needle (PEN NEEDLES) 30G X 5 MM MISC; 1 each by Does not apply route 4 (four) times daily as needed. E11.65  Dispense: 100 each; Refill: 5  3. Essential hypertension Stable. Continue bp medication as prescribed   4. Dysuria - UA/M w/rflx Culture, Routine  General Counseling: Lliam verbalizes understanding of the findings of todays visit and agrees with plan of treatment. I have discussed any further diagnostic evaluation that may be needed or ordered today. We also reviewed his medications today. he has been encouraged  to call the office with any questions or concerns that should arise related to todays visit.    Counseling:  Diabetes Counseling:  1. Addition of ACE inh/ ARB'S for nephroprotection. Microalbumin is updated  2. Diabetic foot care, prevention of complications. Podiatry consult 3. Exercise and lose weight.  4. Diabetic eye examination, Diabetic eye exam is updated  5. Monitor blood sugar closlely. nutrition counseling.  6. Sign and symptoms of hypoglycemia including shaking sweating,confusion and headaches.  This patient was seen by Leretha Pol FNP Collaboration with Dr Lavera Guise as a part of collaborative care agreement  Orders Placed This Encounter  Procedures  . UA/M w/rflx Culture, Routine  . POCT HgB A1C    Meds ordered this encounter  Medications  . Insulin Pen Needle (PEN NEEDLES) 30G X 5 MM MISC    Sig: 1 each by Does not apply route 4 (four) times daily as needed. E11.65    Dispense:  100 each    Refill:  5    Order Specific Question:   Supervising Provider    Answer:   Lavera Guise [id number]    Time spent: Vivian, MD  Internal Medicine

## 2019-04-25 LAB — UA/M W/RFLX CULTURE, ROUTINE
Bilirubin, UA: NEGATIVE
Glucose, UA: NEGATIVE
Ketones, UA: NEGATIVE
Leukocytes,UA: NEGATIVE
Nitrite, UA: NEGATIVE
Protein,UA: NEGATIVE
RBC, UA: NEGATIVE
Specific Gravity, UA: 1.019 (ref 1.005–1.030)
Urobilinogen, Ur: 0.2 mg/dL (ref 0.2–1.0)
pH, UA: 6 (ref 5.0–7.5)

## 2019-04-25 LAB — MICROSCOPIC EXAMINATION
Bacteria, UA: NONE SEEN
Casts: NONE SEEN /lpf

## 2019-06-16 ENCOUNTER — Other Ambulatory Visit: Payer: Self-pay

## 2019-06-16 MED ORDER — LOVASTATIN 20 MG PO TABS: 20.0000 mg | ORAL_TABLET | Freq: Every day | ORAL | 1 refills | Status: DC

## 2019-06-18 ENCOUNTER — Other Ambulatory Visit: Payer: Self-pay

## 2019-06-18 DIAGNOSIS — E1165 Type 2 diabetes mellitus with hyperglycemia: Secondary | ICD-10-CM

## 2019-06-18 MED ORDER — LOVASTATIN 20 MG PO TABS: 20.0000 mg | ORAL_TABLET | Freq: Every day | ORAL | 1 refills | Status: DC

## 2019-06-18 MED ORDER — OZEMPIC (0.25 OR 0.5 MG/DOSE) 2 MG/1.5ML ~~LOC~~ SOPN: 0.5000 mg | PEN_INJECTOR | SUBCUTANEOUS | 5 refills | Status: DC

## 2019-07-23 ENCOUNTER — Telehealth: Payer: Self-pay

## 2019-07-23 NOTE — Telephone Encounter (Signed)
CONFIRMED AND SCREENED FOR 07-25-19 OV.

## 2019-07-25 ENCOUNTER — Encounter: Payer: Self-pay | Admitting: Nurse Practitioner

## 2019-07-25 ENCOUNTER — Ambulatory Visit: Payer: 59 | Admitting: Nurse Practitioner

## 2019-07-25 ENCOUNTER — Other Ambulatory Visit: Payer: Self-pay

## 2019-07-25 VITALS — BP 140/78 | HR 84 | Temp 96.9°F | Resp 16 | Ht 71.0 in | Wt 317.4 lb

## 2019-07-25 DIAGNOSIS — N478 Other disorders of prepuce: Secondary | ICD-10-CM

## 2019-07-25 DIAGNOSIS — E1165 Type 2 diabetes mellitus with hyperglycemia: Secondary | ICD-10-CM | POA: Diagnosis not present

## 2019-07-25 DIAGNOSIS — Z794 Long term (current) use of insulin: Secondary | ICD-10-CM

## 2019-07-25 DIAGNOSIS — I10 Essential (primary) hypertension: Secondary | ICD-10-CM | POA: Diagnosis not present

## 2019-07-25 LAB — POCT GLYCOSYLATED HEMOGLOBIN (HGB A1C): Hemoglobin A1C: 7.7 % — AB (ref 4.0–5.6)

## 2019-07-25 MED ORDER — OZEMPIC (1 MG/DOSE) 2 MG/1.5ML ~~LOC~~ SOPN: 1.0000 mg | PEN_INJECTOR | SUBCUTANEOUS | 5 refills | Status: DC

## 2019-07-25 NOTE — Progress Notes (Signed)
[facility] [place], [place] [address]  Internal MEDICINE  Office Visit Note  Patient Name: Brett Wiley  [id number]  [id number]  Date of Service: 07/25/2019  Chief Complaint  Patient presents with  . Follow-up  . Diabetes  . Hyperlipidemia  . Hypertension    The patient Is here for follow up visit. Blood sugars are elevated just a little. HgbA1c was 7.7 today. This is up from 7.4 last check. He did cut his basaglar back to 45 units. He is prescribed 50 units. He does use mealtime coverage between 2 and 4 units of regular insulin when needed. He also takes ozempic 0.5mg  weekly. Blood pressure is well controlled and weight is stable.  He would like to consult with urologist. He would like to have circumcision procedure. Is having trouble with retraction of foreskin. Skin is fragile and will often develop breaks in the skin when retracting the skin and cleaning the area.       Current Medication: Outpatient Encounter Medications as of 07/25/2019  Medication Sig  . diltiazem (CARTIA XT) 240 MG 24 hr capsule Take 1 capsule (240 mg total) by mouth every morning.  Marland Kitchen HUMALOG KWIKPEN 100 UNIT/ML KwikPen 2-4 Units Sub 3 times daily PRN, max unit 14 units daily  only if blood sugar is above 100, with meal per sliding scale  . Insulin Glargine (BASAGLAR KWIKPEN) 100 UNIT/ML SOPN Inject up to 50 units every evening  . Insulin Pen Needle (PEN NEEDLES) 30G X 5 MM MISC 1 each by Does not apply route 4 (four) times daily as needed. E11.65  . lisinopril (ZESTRIL) 2.5 MG tablet Take 1 tablet (2.5 mg total) by mouth daily.  Marland Kitchen lovastatin (MEVACOR) 20 MG tablet Take 1 tablet (20 mg total) by mouth at bedtime.  . sildenafil (REVATIO) 20 MG tablet Take 1 tablet (20 mg total) by mouth daily as needed.  . [DISCONTINUED] Semaglutide,0.25 or 0.5MG /DOS, (OZEMPIC, 0.25 OR 0.5 MG/DOSE,) 2 MG/1.5ML SOPN Inject 0.5 mg into the skin once a week. ON WEDNESDAYS  . Semaglutide, 1 MG/DOSE,  (OZEMPIC, 1 MG/DOSE,) 2 MG/1.5ML SOPN Inject 1 mg into the skin once a week.   No facility-administered encounter medications on file as of 07/25/2019.    Surgical History: Past Surgical History:  Procedure Laterality Date  . COLONOSCOPY WITH PROPOFOL N/A 12/18/2017   Procedure: COLONOSCOPY WITH PROPOFOL;  Surgeon: Jonathon Bellows, MD;  Location: [facility] ENDOSCOPY;  Service: Gastroenterology;  Laterality: N/A;  . RECTAL EXAM UNDER ANESTHESIA N/A 05/24/2018   Procedure: RECTAL EXAM UNDER ANESTHESIA WITH POSSIBLE POLYPECTOMY;  Surgeon: Vickie Epley, MD;  Location: [facility] ORS;  Service: General;  Laterality: N/A;    Medical History: Past Medical History:  Diagnosis Date  . Arthritis   . Atrial fibrillation ([place]) 1993  . Diabetes mellitus without complication ([place])   . History of kidney stones   . Hypercholesteremia   . Hypertension     Family History: Family History  Problem Relation Age of Onset  . Diabetes Mother   . Diabetes Father   . Hypertension Father     Social History   Socioeconomic History  . Marital status: Single    Spouse name: Not on file  . Number of children: Not on file  . Years of education: Not on file  . Highest education level: Not on file  Occupational History  . Not on file  Tobacco Use  . Smoking status: Never Smoker  . Smokeless tobacco: Never Used  Substance and Sexual Activity  . Alcohol use: No    Alcohol/week: 0.0 standard drinks  . Drug use: No  . Sexual activity: Not on file  Other Topics Concern  . Not on file  Social History Narrative  . Not on file   Social Determinants of Health   Financial Resource Strain:   . Difficulty of Paying Living Expenses: Not on file  Food Insecurity:   . Worried About Charity fundraiser in the Last Year: Not on file  . Ran Out of Food in the Last Year: Not on file  Transportation Needs:   . Lack of Transportation (Medical): Not on file  . Lack of Transportation (Non-Medical): Not on file  Physical  Activity:   . Days of Exercise per Week: Not on file  . Minutes of Exercise per Session: Not on file  Stress:   . Feeling of Stress : Not on file  Social Connections:   . Frequency of Communication with Friends and Family: Not on file  . Frequency of Social Gatherings with Friends and Family: Not on file  . Attends Religious Services: Not on file  . Active Member of Clubs or Organizations: Not on file  . Attends Archivist Meetings: Not on file  . Marital Status: Not on file  Intimate Partner Violence:   . Fear of Current or Ex-Partner: Not on file  . Emotionally Abused: Not on file  . Physically Abused: Not on file  . Sexually Abused: Not on file      Review of Systems  Constitutional: Negative for activity change, chills, fatigue and unexpected weight change.  HENT: Negative for congestion, postnasal drip, rhinorrhea, sneezing and sore throat.   Respiratory: Negative for cough, chest tightness, shortness of breath and wheezing.   Cardiovascular: Negative for chest pain and palpitations.  Gastrointestinal: Negative for abdominal pain, constipation, diarrhea, nausea and vomiting.  Endocrine: Negative for cold intolerance, heat intolerance, polydipsia and polyuria.       Blood sugars elevated some since his last visit.   Genitourinary: Negative for dysuria, frequency, hematuria and urgency.       Has trouble with retraction of foreskin of penis for cleaning. Skin breaks present. Would like to consult with urology for possible circumcision.   Musculoskeletal: Positive for back pain. Negative for arthralgias, joint swelling and neck pain.       Intermittent.   Skin: Negative for rash.  Allergic/Immunologic: Negative for environmental allergies.  Neurological: Negative for dizziness, tremors, numbness and headaches.  Hematological: Negative for adenopathy. Does not bruise/bleed easily.  Psychiatric/Behavioral: Negative for behavioral problems (Depression), sleep disturbance  and suicidal ideas. The patient is not nervous/anxious.    Today's Vitals   07/25/19 1351  BP: 140/78  Pulse: 84  Resp: 16  Temp: (!) 96.9 F (36.1 C)  SpO2: 94%  Weight: (!) 317 lb 6.4 oz (144 kg)  Height: 5\' 11"  (1.803 m)   Body mass index is 44.27 kg/m.  Physical Exam Vitals and nursing note reviewed.  Constitutional:      General: He is not in acute distress.    Appearance: Normal appearance. He is well-developed. He is obese. He is not diaphoretic.  HENT:     Head: Normocephalic and atraumatic.     Mouth/Throat:     Pharynx: No oropharyngeal exudate.  Eyes:     Pupils: Pupils are equal, round, and reactive to light.  Neck:     Thyroid: No thyromegaly.     Vascular: No  carotid bruit or JVD.     Trachea: No tracheal deviation.  Cardiovascular:     Rate and Rhythm: Normal rate and regular rhythm.     Heart sounds: Normal heart sounds. No murmur. No friction rub. No gallop.   Pulmonary:     Effort: Pulmonary effort is normal. No respiratory distress.     Breath sounds: Normal breath sounds. No wheezing or rales.  Chest:     Chest wall: No tenderness.  Abdominal:     Palpations: Abdomen is soft.  Musculoskeletal:        General: Normal range of motion.     Cervical back: Normal range of motion and neck supple.  Lymphadenopathy:     Cervical: No cervical adenopathy.  Skin:    General: Skin is warm and dry.  Neurological:     Mental Status: He is alert and oriented to person, place, and time.     Cranial Nerves: No cranial nerve deficit.     Sensory: Sensory deficit present.  Psychiatric:        Mood and Affect: Mood normal.        Behavior: Behavior normal.        Thought Content: Thought content normal.        Judgment: Judgment normal.    Assessment/Plan: 1. Type 2 diabetes mellitus with hyperglycemia, with [place]-term current use of insulin (HCC) HgbA1c up to 7.7 today. Will increase dose of ozempic to 1mg  weekly. Continue basaglar and mealtime coverage as  previously prescribed. Refer for diabetic eye exam.  - POCT HgB A1C - Ambulatory referral to Ophthalmology - Semaglutide, 1 MG/DOSE, (OZEMPIC, 1 MG/DOSE,) 2 MG/1.5ML SOPN; Inject 1 mg into the skin once a week.  Dispense: 1 pen; Refill: 5  2. Foreskin does not retract Has trouble with retraction of foreskin of penis for cleaning. Skin breaks present. Would like to consult with urology for possible circumcision.  - Ambulatory referral to Urology  3. Essential hypertension Stable. Continue bp medication as prescribed   General Counseling: Damico verbalizes understanding of the findings of todays visit and agrees with plan of treatment. I have discussed any further diagnostic evaluation that may be needed or ordered today. We also reviewed his medications today. he has been encouraged to call the office with any questions or concerns that should arise related to todays visit.  Diabetes Counseling:  1. Addition of ACE inh/ ARB'S for nephroprotection. Microalbumin is updated  2. Diabetic foot care, prevention of complications. Podiatry consult 3. Exercise and lose weight.  4. Diabetic eye examination, Diabetic eye exam is updated  5. Monitor blood sugar closlely. nutrition counseling.  6. Sign and symptoms of hypoglycemia including shaking sweating,confusion and headaches.  This patient was seen by Leretha Pol FNP Collaboration with Dr Lavera Guise as a part of collaborative care agreement  Orders Placed This Encounter  Procedures  . Ambulatory referral to Ophthalmology  . Ambulatory referral to Urology  . POCT HgB A1C    Meds ordered this encounter  Medications  . Semaglutide, 1 MG/DOSE, (OZEMPIC, 1 MG/DOSE,) 2 MG/1.5ML SOPN    Sig: Inject 1 mg into the skin once a week.    Dispense:  1 pen    Refill:  5    Change from 0.5mg  weekly to 1mg  weekly.    Order Specific Question:   Supervising Provider    Answer:   Lavera Guise [id number]    Total time spent: 30 Minutes   Time  spent includes review  of chart, medications, test results, and follow up plan with the patient.      Dr Lavera Guise Internal medicine

## 2019-08-28 ENCOUNTER — Ambulatory Visit: Payer: Self-pay | Admitting: Urology

## 2019-09-04 NOTE — Progress Notes (Incomplete)
09/05/19 11:56 PM   Brett Wiley 21-Jul-1955 [id number]  Referring provider: Ronnell Freshwater, NP [address] [place],  [place] [address]  No chief complaint on file.   HPI: 64 yo M who presents today for the evaluation and management of difficulties with retracting foreskin.   -Trouble with retraction of foreskin.  -Skin fragile and often develops breaks in skin when retracting/cleaning    1. ***  *** 2. *** *** 3. *** ***   PMH: Past Medical History:  Diagnosis Date  . Arthritis   . Atrial fibrillation ([place]) 1993  . Diabetes mellitus without complication ([place])   . History of kidney stones   . Hypercholesteremia   . Hypertension     Surgical History: Past Surgical History:  Procedure Laterality Date  . COLONOSCOPY WITH PROPOFOL N/A 12/18/2017   Procedure: COLONOSCOPY WITH PROPOFOL;  Surgeon: Jonathon Bellows, MD;  Location: [facility] ENDOSCOPY;  Service: Gastroenterology;  Laterality: N/A;  . RECTAL EXAM UNDER ANESTHESIA N/A 05/24/2018   Procedure: RECTAL EXAM UNDER ANESTHESIA WITH POSSIBLE POLYPECTOMY;  Surgeon: Vickie Epley, MD;  Location: [facility] ORS;  Service: General;  Laterality: N/A;    Home Medications:  Allergies as of 09/05/2019   No Known Allergies     Medication List       Accurate as of September 04, 2019 11:56 PM. If you have any questions, ask your nurse or doctor.        Basaglar KwikPen 100 UNIT/ML Inject up to 50 units every evening   diltiazem 240 MG 24 hr capsule Commonly known as: Cartia XT Take 1 capsule (240 mg total) by mouth every morning.   HumaLOG KwikPen 100 UNIT/ML KwikPen Generic drug: insulin lispro 2-4 Units Sub 3 times daily PRN, max unit 14 units daily  only if blood sugar is above 100, with meal per sliding scale   lisinopril 2.5 MG tablet Commonly known as: ZESTRIL Take 1 tablet (2.5 mg total) by mouth daily.   lovastatin 20 MG tablet Commonly known as: MEVACOR Take 1 tablet (20 mg total) by mouth at bedtime.     Ozempic (1 MG/DOSE) 2 MG/1.5ML Sopn Generic drug: Semaglutide (1 MG/DOSE) Inject 1 mg into the skin once a week.   Pen Needles 30G X 5 MM Misc 1 each by Does not apply route 4 (four) times daily as needed. E11.65   sildenafil 20 MG tablet Commonly known as: REVATIO Take 1 tablet (20 mg total) by mouth daily as needed.       Allergies: No Known Allergies  Family History: Family History  Problem Relation Age of Onset  . Diabetes Mother   . Diabetes Father   . Hypertension Father     Social History:  reports that he has never smoked. He has never used smokeless tobacco. He reports that he does not drink alcohol or use drugs.   Physical Exam: There were no vitals taken for this visit.  Constitutional:  Alert and oriented, No acute distress. HEENT: [place] AT, moist mucus membranes.  Trachea midline, no masses. Cardiovascular: No clubbing, cyanosis, or edema. Respiratory: Normal respiratory effort, no increased work of breathing. GI: Abdomen is soft, nontender, nondistended, no abdominal masses GU: No CVA tenderness Lymph: No cervical or inguinal lymphadenopathy. Skin: No rashes, bruises or suspicious lesions. Neurologic: Grossly intact, no focal deficits, moving all 4 extremities. Psychiatric: Normal mood and affect.  Laboratory Data:   Lab Results  Component Value Date   HGBA1C 7.7 (A) 07/25/2019    Urinalysis  Pertinent Imaging: ***   Assessment & Plan:    @DIAGMED @  No follow-ups on file.  [facility] [address] [place],  [address] [phone]  I, Lucas Mallow, am acting as a scribe for Dr. Nicki Reaper C. Stoioff,  {Add Media planner

## 2019-09-05 ENCOUNTER — Ambulatory Visit: Payer: Self-pay | Admitting: Urology

## 2019-09-05 ENCOUNTER — Encounter: Payer: Self-pay | Admitting: Urology

## 2019-09-22 ENCOUNTER — Other Ambulatory Visit: Payer: Self-pay

## 2019-09-22 DIAGNOSIS — I10 Essential (primary) hypertension: Secondary | ICD-10-CM

## 2019-09-22 MED ORDER — LISINOPRIL 2.5 MG PO TABS: 2.5000 mg | ORAL_TABLET | Freq: Every day | ORAL | 5 refills | Status: DC

## 2019-09-22 MED ORDER — DILTIAZEM HCL ER COATED BEADS 240 MG PO CP24: 240.0000 mg | ORAL_CAPSULE | ORAL | 5 refills | Status: DC

## 2019-10-21 ENCOUNTER — Telehealth: Payer: Self-pay

## 2019-10-21 NOTE — Telephone Encounter (Signed)
Confirmed and screened for 10-23-19 ov.

## 2019-10-23 ENCOUNTER — Encounter: Payer: Self-pay | Admitting: Nurse Practitioner

## 2019-10-23 ENCOUNTER — Other Ambulatory Visit: Payer: Self-pay

## 2019-10-23 ENCOUNTER — Ambulatory Visit (INDEPENDENT_AMBULATORY_CARE_PROVIDER_SITE_OTHER): Payer: 59 | Admitting: Nurse Practitioner

## 2019-10-23 VITALS — BP 127/77 | HR 83 | Resp 16 | Ht 71.0 in | Wt 314.0 lb

## 2019-10-23 DIAGNOSIS — E7800 Pure hypercholesterolemia, unspecified: Secondary | ICD-10-CM

## 2019-10-23 DIAGNOSIS — I10 Essential (primary) hypertension: Secondary | ICD-10-CM | POA: Diagnosis not present

## 2019-10-23 DIAGNOSIS — E1165 Type 2 diabetes mellitus with hyperglycemia: Secondary | ICD-10-CM | POA: Diagnosis not present

## 2019-10-23 DIAGNOSIS — Z794 Long term (current) use of insulin: Secondary | ICD-10-CM | POA: Diagnosis not present

## 2019-10-23 LAB — POCT GLYCOSYLATED HEMOGLOBIN (HGB A1C): Hemoglobin A1C: 7.7 % — AB (ref 4.0–5.6)

## 2019-10-23 MED ORDER — BASAGLAR KWIKPEN 100 UNIT/ML ~~LOC~~ SOPN: PEN_INJECTOR | SUBCUTANEOUS | 3 refills | Status: DC

## 2019-10-23 NOTE — Progress Notes (Signed)
[facility] [place], [place] [address]  Internal MEDICINE  Office Visit Note  Patient Name: Brett Wiley  [id number]  [id number]  Date of Service: 10/23/2019  Chief Complaint  Patient presents with  . Diabetes  . Hypertension    The patient is here for routine follow up visit. Blood sugars steady. HgbA1c 7.7 which is the same as prior checks. Feels good. Had eyes examined back in Early March. Did get new glasses. Had both Moderna COVID 19 vaccines and these are documented in his record. Blood pressure is well managed. States that he still needs to get routine, fasting labs done. Order slip was given at his last visit.       Current Medication: Outpatient Encounter Medications as of 10/23/2019  Medication Sig  . diltiazem (CARTIA XT) 240 MG 24 hr capsule Take 1 capsule (240 mg total) by mouth every morning.  Marland Kitchen HUMALOG KWIKPEN 100 UNIT/ML KwikPen 2-4 Units Sub 3 times daily PRN, max unit 14 units daily  only if blood sugar is above 100, with meal per sliding scale  . Insulin Glargine (BASAGLAR KWIKPEN) 100 UNIT/ML Inject up to 55 units every evening  . Insulin Pen Needle (PEN NEEDLES) 30G X 5 MM MISC 1 each by Does not apply route 4 (four) times daily as needed. E11.65  . lisinopril (ZESTRIL) 2.5 MG tablet Take 1 tablet (2.5 mg total) by mouth daily.  Marland Kitchen lovastatin (MEVACOR) 20 MG tablet Take 1 tablet (20 mg total) by mouth at bedtime.  . Semaglutide, 1 MG/DOSE, (OZEMPIC, 1 MG/DOSE,) 2 MG/1.5ML SOPN Inject 1 mg into the skin once a week.  . sildenafil (REVATIO) 20 MG tablet Take 1 tablet (20 mg total) by mouth daily as needed.  . [DISCONTINUED] Insulin Glargine (BASAGLAR KWIKPEN) 100 UNIT/ML SOPN Inject up to 50 units every evening   No facility-administered encounter medications on file as of 10/23/2019.    Surgical History: Past Surgical History:  Procedure Laterality Date  . COLONOSCOPY WITH PROPOFOL N/A 12/18/2017   Procedure: COLONOSCOPY WITH  PROPOFOL;  Surgeon: Jonathon Bellows, MD;  Location: [facility] ENDOSCOPY;  Service: Gastroenterology;  Laterality: N/A;  . RECTAL EXAM UNDER ANESTHESIA N/A 05/24/2018   Procedure: RECTAL EXAM UNDER ANESTHESIA WITH POSSIBLE POLYPECTOMY;  Surgeon: Vickie Epley, MD;  Location: [facility] ORS;  Service: General;  Laterality: N/A;    Medical History: Past Medical History:  Diagnosis Date  . Arthritis   . Atrial fibrillation ([place]) 1993  . Diabetes mellitus without complication ([place])   . History of kidney stones   . Hypercholesteremia   . Hypertension     Family History: Family History  Problem Relation Age of Onset  . Diabetes Mother   . Diabetes Father   . Hypertension Father     Social History   Socioeconomic History  . Marital status: Single    Spouse name: Not on file  . Number of children: Not on file  . Years of education: Not on file  . Highest education level: Not on file  Occupational History  . Not on file  Tobacco Use  . Smoking status: Never Smoker  . Smokeless tobacco: Never Used  Substance and Sexual Activity  . Alcohol use: No    Alcohol/week: 0.0 standard drinks  . Drug use: No  . Sexual activity: Not on file  Other Topics Concern  . Not on file  Social History Narrative  . Not on file   Social Determinants of Health   Financial  Resource Strain:   . Difficulty of Paying Living Expenses:   Food Insecurity:   . Worried About Charity fundraiser in the Last Year:   . Arboriculturist in the Last Year:   Transportation Needs:   . Film/video editor (Medical):   Marland Kitchen Lack of Transportation (Non-Medical):   Physical Activity:   . Days of Exercise per Week:   . Minutes of Exercise per Session:   Stress:   . Feeling of Stress :   Social Connections:   . Frequency of Communication with Friends and Family:   . Frequency of Social Gatherings with Friends and Family:   . Attends Religious Services:   . Active Member of Clubs or Organizations:   . Attends Theatre manager Meetings:   Marland Kitchen Marital Status:   Intimate Partner Violence:   . Fear of Current or Ex-Partner:   . Emotionally Abused:   Marland Kitchen Physically Abused:   . Sexually Abused:       Review of Systems  Constitutional: Negative for activity change, chills, fatigue and unexpected weight change.  HENT: Negative for congestion, postnasal drip, rhinorrhea, sneezing and sore throat.   Respiratory: Negative for cough, chest tightness, shortness of breath and wheezing.   Cardiovascular: Negative for chest pain and palpitations.  Gastrointestinal: Negative for abdominal pain, constipation, diarrhea, nausea and vomiting.  Endocrine: Negative for cold intolerance, heat intolerance, polydipsia and polyuria.       Blood sugars elevated some since his last visit.   Musculoskeletal: Positive for back pain. Negative for arthralgias, joint swelling and neck pain.       Intermittent.   Skin: Negative for rash.  Allergic/Immunologic: Negative for environmental allergies.  Neurological: Negative for dizziness, tremors, numbness and headaches.  Hematological: Negative for adenopathy. Does not bruise/bleed easily.  Psychiatric/Behavioral: Negative for behavioral problems (Depression), sleep disturbance and suicidal ideas. The patient is not nervous/anxious.     Today's Vitals   10/23/19 1349  BP: 127/77  Pulse: 83  Resp: 16  SpO2: 94%  Weight: (!) 314 lb (142.4 kg)  Height: 5\' 11"  (1.803 m)   Body mass index is 43.79 kg/m.  Physical Exam Vitals and nursing note reviewed.  Constitutional:      General: He is not in acute distress.    Appearance: Normal appearance. He is well-developed. He is obese. He is not diaphoretic.  HENT:     Head: Normocephalic and atraumatic.     Mouth/Throat:     Pharynx: No oropharyngeal exudate.  Eyes:     Pupils: Pupils are equal, round, and reactive to light.  Neck:     Thyroid: No thyromegaly.     Vascular: No carotid bruit or JVD.     Trachea: No tracheal  deviation.  Cardiovascular:     Rate and Rhythm: Normal rate and regular rhythm.     Heart sounds: Normal heart sounds. No murmur. No friction rub. No gallop.   Pulmonary:     Effort: Pulmonary effort is normal. No respiratory distress.     Breath sounds: Normal breath sounds. No wheezing or rales.  Chest:     Chest wall: No tenderness.  Abdominal:     Palpations: Abdomen is soft.  Musculoskeletal:        General: Normal range of motion.     Cervical back: Normal range of motion and neck supple.  Lymphadenopathy:     Cervical: No cervical adenopathy.  Skin:    General: Skin is warm and  dry.  Neurological:     Mental Status: He is alert and oriented to person, place, and time.     Cranial Nerves: No cranial nerve deficit.  Psychiatric:        Mood and Affect: Mood normal.        Behavior: Behavior normal.        Thought Content: Thought content normal.        Judgment: Judgment normal.    Assessment/Plan: 1. Type 2 diabetes mellitus with hyperglycemia, with [place]-term current use of insulin (HCC) - POCT HgB A1C 7.7 today. Increase basaglar to 55 units every day. Continue ozempic 1mg  weekly. Monitor blood sugars closely. Recheck HgbA1c at next visit.  - Insulin Glargine (BASAGLAR KWIKPEN) 100 UNIT/ML; Inject up to 55 units every evening  Dispense: 3 pen; Refill: 3  2. Essential hypertension Stable. Continue bp medication as prescribed   3. Hypercholesteremia Will adjust statin as indicated once labs back.   General Counseling: Brett Wiley verbalizes understanding of the findings of todays visit and agrees with plan of treatment. I have discussed any further diagnostic evaluation that may be needed or ordered today. We also reviewed his medications today. he has been encouraged to call the office with any questions or concerns that should arise related to todays visit.  Diabetes Counseling:  1. Addition of ACE inh/ ARB'S for nephroprotection. Microalbumin is updated  2. Diabetic  foot care, prevention of complications. Podiatry consult 3. Exercise and lose weight.  4. Diabetic eye examination, Diabetic eye exam is updated  5. Monitor blood sugar closlely. nutrition counseling.  6. Sign and symptoms of hypoglycemia including shaking sweating,confusion and headaches.  This patient was seen by Leretha Pol FNP Collaboration with Dr Lavera Guise as a part of collaborative care agreement  Orders Placed This Encounter  Procedures  . POCT HgB A1C    Meds ordered this encounter  Medications  . Insulin Glargine (BASAGLAR KWIKPEN) 100 UNIT/ML    Sig: Inject up to 55 units every evening    Dispense:  3 pen    Refill:  3    Reduced dose    Order Specific Question:   Supervising Provider    Answer:   Lavera Guise [id number]    Total time spent: 30 Minutes  Time spent includes review of chart, medications, test results, and follow up plan with the patient.      Dr Lavera Guise Internal medicine

## 2019-12-29 ENCOUNTER — Other Ambulatory Visit: Payer: Self-pay

## 2019-12-29 DIAGNOSIS — Z794 Long term (current) use of insulin: Secondary | ICD-10-CM

## 2019-12-29 DIAGNOSIS — E1165 Type 2 diabetes mellitus with hyperglycemia: Secondary | ICD-10-CM

## 2019-12-29 MED ORDER — BASAGLAR KWIKPEN 100 UNIT/ML ~~LOC~~ SOPN: PEN_INJECTOR | SUBCUTANEOUS | 3 refills | Status: DC

## 2019-12-30 ENCOUNTER — Other Ambulatory Visit: Payer: Self-pay

## 2019-12-30 DIAGNOSIS — E1165 Type 2 diabetes mellitus with hyperglycemia: Secondary | ICD-10-CM

## 2019-12-30 MED ORDER — BASAGLAR KWIKPEN 100 UNIT/ML ~~LOC~~ SOPN: PEN_INJECTOR | SUBCUTANEOUS | 3 refills | Status: DC

## 2020-01-01 ENCOUNTER — Other Ambulatory Visit: Payer: Self-pay

## 2020-01-01 DIAGNOSIS — E1165 Type 2 diabetes mellitus with hyperglycemia: Secondary | ICD-10-CM

## 2020-01-01 MED ORDER — BASAGLAR KWIKPEN 100 UNIT/ML ~~LOC~~ SOPN: PEN_INJECTOR | SUBCUTANEOUS | 3 refills | Status: DC

## 2020-01-09 NOTE — Telephone Encounter (Signed)
Can you ind out how much he is actually taking so I can adjust the prescription? Thanks.

## 2020-01-13 ENCOUNTER — Other Ambulatory Visit: Payer: Self-pay

## 2020-01-13 DIAGNOSIS — E1165 Type 2 diabetes mellitus with hyperglycemia: Secondary | ICD-10-CM

## 2020-01-13 MED ORDER — HUMALOG KWIKPEN 100 UNIT/ML ~~LOC~~ SOPN: PEN_INJECTOR | SUBCUTANEOUS | 1 refills | Status: DC

## 2020-01-13 MED ORDER — BASAGLAR KWIKPEN 100 UNIT/ML ~~LOC~~ SOPN: PEN_INJECTOR | SUBCUTANEOUS | 3 refills | Status: DC

## 2020-01-15 ENCOUNTER — Other Ambulatory Visit: Payer: Self-pay

## 2020-01-15 ENCOUNTER — Telehealth: Payer: Self-pay

## 2020-01-15 NOTE — Telephone Encounter (Signed)
Samples ready for pickup for basaglar

## 2020-01-19 ENCOUNTER — Telehealth: Payer: Self-pay

## 2020-01-19 NOTE — Telephone Encounter (Signed)
done

## 2020-01-20 ENCOUNTER — Telehealth: Payer: Self-pay

## 2020-01-20 NOTE — Telephone Encounter (Signed)
Confirmed and screened for 01-22-20 ov. 

## 2020-01-22 ENCOUNTER — Encounter: Payer: Self-pay | Admitting: Nurse Practitioner

## 2020-01-22 ENCOUNTER — Ambulatory Visit: Payer: 59 | Admitting: Nurse Practitioner

## 2020-01-22 ENCOUNTER — Other Ambulatory Visit: Payer: Self-pay

## 2020-01-22 VITALS — BP 127/75 | HR 83 | Temp 97.5°F | Resp 16 | Ht 71.0 in | Wt 316.8 lb

## 2020-01-22 DIAGNOSIS — Z794 Long term (current) use of insulin: Secondary | ICD-10-CM | POA: Diagnosis not present

## 2020-01-22 DIAGNOSIS — E1165 Type 2 diabetes mellitus with hyperglycemia: Secondary | ICD-10-CM

## 2020-01-22 DIAGNOSIS — N4822 Cellulitis of corpus cavernosum and penis: Secondary | ICD-10-CM

## 2020-01-22 DIAGNOSIS — I10 Essential (primary) hypertension: Secondary | ICD-10-CM

## 2020-01-22 DIAGNOSIS — N4889 Other specified disorders of penis: Secondary | ICD-10-CM | POA: Diagnosis not present

## 2020-01-22 MED ORDER — BASAGLAR KWIKPEN 100 UNIT/ML ~~LOC~~ SOPN: PEN_INJECTOR | SUBCUTANEOUS | 5 refills | Status: DC

## 2020-01-22 MED ORDER — HUMALOG KWIKPEN 100 UNIT/ML ~~LOC~~ SOPN: PEN_INJECTOR | SUBCUTANEOUS | 5 refills | Status: DC

## 2020-01-22 MED ORDER — MUPIROCIN 2 % EX OINT: TOPICAL_OINTMENT | CUTANEOUS | 1 refills | Status: AC

## 2020-01-22 MED ORDER — OZEMPIC (1 MG/DOSE) 2 MG/1.5ML ~~LOC~~ SOPN: 1.0000 mg | PEN_INJECTOR | SUBCUTANEOUS | 5 refills | Status: DC

## 2020-01-22 NOTE — Progress Notes (Signed)
[facility] [place], [place] [address]  Internal MEDICINE  Office Visit Note  Patient Name: Brett Wiley  [id number]  [id number]  Date of Service: 02/08/2020  Chief Complaint  Patient presents with  . Follow-up  . Hypertension  . Diabetes  . Quality Metric Gaps    HepC, HIV screening, TDAP  . Hyperlipidemia    The patient is here for routine follow up. blood sugars have improved. Is taking up to 55 units of basal insulin. Barely using mealtime coverage with regular insulin. He states that he feels well and has no concerns or complaints today. His HgbA1c is 8.1, up from 7.7 at the most recent check.  today. His blood pressure is well managed.       Current Medication: Outpatient Encounter Medications as of 01/22/2020  Medication Sig  . diltiazem (CARTIA XT) 240 MG 24 hr capsule Take 1 capsule (240 mg total) by mouth every morning.  Marland Kitchen HUMALOG KWIKPEN 100 UNIT/ML KwikPen 2-4 Units Sub 3 times daily PRN, max unit 14 units daily  only if blood sugar is above 100, with meal per sliding scale  . Insulin Glargine (BASAGLAR KWIKPEN) 100 UNIT/ML Inject up to 55 units every evening  . Insulin Pen Needle (PEN NEEDLES) 30G X 5 MM MISC 1 each by Does not apply route 4 (four) times daily as needed. E11.65  . lisinopril (ZESTRIL) 2.5 MG tablet Take 1 tablet (2.5 mg total) by mouth daily.  Marland Kitchen lovastatin (MEVACOR) 20 MG tablet Take 1 tablet (20 mg total) by mouth at bedtime.  . sildenafil (REVATIO) 20 MG tablet Take 1 tablet (20 mg total) by mouth daily as needed.  . [DISCONTINUED] HUMALOG KWIKPEN 100 UNIT/ML KwikPen 2-4 Units Sub 3 times daily PRN, max unit 14 units daily  only if blood sugar is above 100, with meal per sliding scale  . [DISCONTINUED] Insulin Glargine (BASAGLAR KWIKPEN) 100 UNIT/ML Inject up to 55 units every evening  . [DISCONTINUED] Semaglutide, 1 MG/DOSE, (OZEMPIC, 1 MG/DOSE,) 2 MG/1.5ML SOPN Inject 1 mg into the skin once a week.  . [DISCONTINUED]  Semaglutide, 1 MG/DOSE, (OZEMPIC, 1 MG/DOSE,) 2 MG/1.5ML SOPN Inject 0.75 mLs (1 mg total) into the skin once a week.  . mupirocin ointment (BACTROBAN) 2 % Apply small amount to effected area twice daily for one week.   No facility-administered encounter medications on file as of 01/22/2020.    Surgical History: Past Surgical History:  Procedure Laterality Date  . COLONOSCOPY WITH PROPOFOL N/A 12/18/2017   Procedure: COLONOSCOPY WITH PROPOFOL;  Surgeon: Jonathon Bellows, MD;  [facility] ENDOSCOPY;  Service: Gastroenterology;  Laterality: N/A;  . RECTAL EXAM UNDER ANESTHESIA N/A 05/24/2018   Procedure: RECTAL EXAM UNDER ANESTHESIA WITH POSSIBLE POLYPECTOMY;  Surgeon: Vickie Epley, MD;  Location: [facility] ORS;  Service: General;  Laterality: N/A;    Medical History: Past Medical History:  Diagnosis Date  . Arthritis   . Atrial fibrillation ([place]) 1993  . Diabetes mellitus without complication ([place])   . History of kidney stones   . Hypercholesteremia   . Hypertension     Family History: Family History  Problem Relation Age of Onset  . Diabetes Mother   . Diabetes Father   . Hypertension Father     Social History   Socioeconomic History  . Marital status: Single    Spouse name: Not on file  . Number of children: Not on file  . Years of education: Not on file  . Highest education  level: Not on file  Occupational History  . Not on file  Tobacco Use  . Smoking status: Never Smoker  . Smokeless tobacco: Never Used  Vaping Use  . Vaping Use: Never used  Substance and Sexual Activity  . Alcohol use: No    Alcohol/week: 0.0 standard drinks  . Drug use: No  . Sexual activity: Not on file  Other Topics Concern  . Not on file  Social History Narrative  . Not on file   Social Determinants of Health   Financial Resource Strain:   . Difficulty of Paying Living Expenses: Not on file  Food Insecurity:   . Worried About Charity fundraiser in the Last Year: Not on file  . Ran  Out of Food in the Last Year: Not on file  Transportation Needs:   . Lack of Transportation (Medical): Not on file  . Lack of Transportation (Non-Medical): Not on file  Physical Activity:   . Days of Exercise per Week: Not on file  . Minutes of Exercise per Session: Not on file  Stress:   . Feeling of Stress : Not on file  Social Connections:   . Frequency of Communication with Friends and Family: Not on file  . Frequency of Social Gatherings with Friends and Family: Not on file  . Attends Religious Services: Not on file  . Active Member of Clubs or Organizations: Not on file  . Attends Archivist Meetings: Not on file  . Marital Status: Not on file  Intimate Partner Violence:   . Fear of Current or Ex-Partner: Not on file  . Emotionally Abused: Not on file  . Physically Abused: Not on file  . Sexually Abused: Not on file      Review of Systems  Constitutional: Negative for activity change, chills, fatigue and unexpected weight change.  HENT: Negative for congestion, postnasal drip, rhinorrhea, sneezing and sore throat.   Respiratory: Negative for cough, chest tightness, shortness of breath and wheezing.   Cardiovascular: Negative for chest pain and palpitations.  Gastrointestinal: Negative for abdominal pain, constipation, diarrhea, nausea and vomiting.  Endocrine: Negative for cold intolerance, heat intolerance, polydipsia and polyuria.       Blood sugars are slightly elevated.   Genitourinary:       The patient states that he has skin lesion present around the foreskin of the penis. This acts up from time to time.   Musculoskeletal: Positive for back pain. Negative for arthralgias, joint swelling and neck pain.       Intermittent.   Skin: Negative for rash.  Allergic/Immunologic: Negative for environmental allergies.  Neurological: Negative for dizziness, tremors, numbness and headaches.  Hematological: Negative for adenopathy. Does not bruise/bleed easily.   Psychiatric/Behavioral: Negative for behavioral problems (Depression), sleep disturbance and suicidal ideas. The patient is not nervous/anxious.     Today's Vitals   01/22/20 1521  BP: 127/75  Pulse: 83  Resp: 16  Temp: (!) 97.5 F (36.4 C)  SpO2: 93%  Weight: (!) 316 lb 12.8 oz (143.7 kg)  Height: 5\' 11"  (1.803 m)   Body mass index is 44.18 kg/m.  Physical Exam Vitals and nursing note reviewed.  Constitutional:      General: He is not in acute distress.    Appearance: Normal appearance. He is well-developed. He is obese. He is not diaphoretic.  HENT:     Head: Normocephalic and atraumatic.     Mouth/Throat:     Pharynx: No oropharyngeal exudate.  Eyes:  Pupils: Pupils are equal, round, and reactive to light.  Neck:     Thyroid: No thyromegaly.     Vascular: No carotid bruit or JVD.     Trachea: No tracheal deviation.  Cardiovascular:     Rate and Rhythm: Normal rate and regular rhythm.     Heart sounds: Normal heart sounds. No murmur heard.  No friction rub. No gallop.   Pulmonary:     Effort: Pulmonary effort is normal. No respiratory distress.     Breath sounds: Normal breath sounds. No wheezing or rales.  Chest:     Chest wall: No tenderness.  Abdominal:     Palpations: Abdomen is soft.  Musculoskeletal:        General: Normal range of motion.     Cervical back: Normal range of motion and neck supple.  Lymphadenopathy:     Cervical: No cervical adenopathy.  Skin:    General: Skin is warm and dry.  Neurological:     Mental Status: He is alert and oriented to person, place, and time.     Cranial Nerves: No cranial nerve deficit.  Psychiatric:        Mood and Affect: Mood normal.        Behavior: Behavior normal.        Thought Content: Thought content normal.        Judgment: Judgment normal.    Assessment/Plan: 1. Type 2 diabetes mellitus with hyperglycemia, with [place]-term current use of insulin (HCC) Blood sugars elevated since most recent visit.  His HgbA1c 8.1 today. Recommend he go back to basal insulin up to 55 units daily. Should use regular insulin for mealtime coverage as needed per sliding scale.  - POCT HgB A1C - Insulin Glargine (BASAGLAR KWIKPEN) 100 UNIT/ML; Inject up to 55 units every evening  Dispense: 30 mL; Refill: 5 - HUMALOG KWIKPEN 100 UNIT/ML KwikPen; 2-4 Units Sub 3 times daily PRN, max unit 14 units daily  only if blood sugar is above 100, with meal per sliding scale  Dispense: 15 mL; Refill: 5  2. Cellulitis of foreskin with necrosis Apply mupirocin ointment up totwice daily for next 1 week.   3. Foreskin fissure Apply mupirocin ointment up totwice daily for next 1 week.  - mupirocin ointment (BACTROBAN) 2 %; Apply small amount to effected area twice daily for one week.  Dispense: 22 g; Refill: 1  4. Essential hypertension Stable. Continue bp medication as prescribed   General Counseling: Saquan verbalizes understanding of the findings of todays visit and agrees with plan of treatment. I have discussed any further diagnostic evaluation that may be needed or ordered today. We also reviewed his medications today. he has been encouraged to call the office with any questions or concerns that should arise related to todays visit.  Diabetes Counseling:  1. Addition of ACE inh/ ARB'S for nephroprotection. Microalbumin is updated  2. Diabetic foot care, prevention of complications. Podiatry consult 3. Exercise and lose weight.  4. Diabetic eye examination, Diabetic eye exam is updated  5. Monitor blood sugar closlely. nutrition counseling.  6. Sign and symptoms of hypoglycemia including shaking sweating,confusion and headaches.  This patient was seen by Leretha Pol FNP Collaboration with Dr Lavera Guise as a part of collaborative care agreement  Orders Placed This Encounter  Procedures  . POCT HgB A1C    Meds ordered this encounter  Medications  . Insulin Glargine (BASAGLAR KWIKPEN) 100 UNIT/ML    Sig:  Inject up to 55 units  every evening    Dispense:  30 mL    Refill:  5    Patient needs enough pens to last him 30 days.    Order Specific Question:   Supervising Provider    Answer:   Lavera Guise [[id number]]  . HUMALOG KWIKPEN 100 UNIT/ML KwikPen    Sig: 2-4 Units Sub 3 times daily PRN, max unit 14 units daily  only if blood sugar is above 100, with meal per sliding scale    Dispense:  15 mL    Refill:  5    Please fill as novolog if this is what patient's insurance prefers. Thanks    Order Specific Question:   Supervising Provider    Answer:   Lavera Guise [[id number]]  . mupirocin ointment (BACTROBAN) 2 %    Sig: Apply small amount to effected area twice daily for one week.    Dispense:  22 g    Refill:  1    Order Specific Question:   Supervising Provider    Answer:   Lavera Guise [[id number]]  . DISCONTD: Semaglutide, 1 MG/DOSE, (OZEMPIC, 1 MG/DOSE,) 2 MG/1.5ML SOPN    Sig: Inject 0.75 mLs (1 mg total) into the skin once a week.    Dispense:  6 mL    Refill:  5    Order Specific Question:   Supervising Provider    Answer:   Lavera Guise [[id number]]    Total time spent: 30 Minutes   Time spent includes review of chart, medications, test results, and follow up plan with the patient.      Dr Lavera Guise Internal medicine

## 2020-01-27 ENCOUNTER — Other Ambulatory Visit: Payer: Self-pay

## 2020-01-27 DIAGNOSIS — E1165 Type 2 diabetes mellitus with hyperglycemia: Secondary | ICD-10-CM

## 2020-01-27 MED ORDER — OZEMPIC (1 MG/DOSE) 2 MG/1.5ML ~~LOC~~ SOPN: 1.0000 mg | PEN_INJECTOR | SUBCUTANEOUS | 5 refills | Status: DC

## 2020-02-08 DIAGNOSIS — N4822 Cellulitis of corpus cavernosum and penis: Secondary | ICD-10-CM | POA: Insufficient documentation

## 2020-02-08 DIAGNOSIS — N4889 Other specified disorders of penis: Secondary | ICD-10-CM | POA: Insufficient documentation

## 2020-02-11 LAB — POCT GLYCOSYLATED HEMOGLOBIN (HGB A1C): Hemoglobin A1C: 8.1 % — AB (ref 4.0–5.6)

## 2020-02-23 ENCOUNTER — Other Ambulatory Visit: Payer: Self-pay

## 2020-02-23 MED ORDER — LOVASTATIN 20 MG PO TABS: 20.0000 mg | ORAL_TABLET | Freq: Every day | ORAL | 1 refills | Status: DC

## 2020-04-07 ENCOUNTER — Other Ambulatory Visit: Payer: Self-pay

## 2020-04-07 DIAGNOSIS — I10 Essential (primary) hypertension: Secondary | ICD-10-CM

## 2020-04-07 MED ORDER — LISINOPRIL 2.5 MG PO TABS: 2.5000 mg | ORAL_TABLET | Freq: Every day | ORAL | 5 refills | Status: DC

## 2020-04-07 MED ORDER — DILTIAZEM HCL ER COATED BEADS 240 MG PO CP24: 240.0000 mg | ORAL_CAPSULE | ORAL | 5 refills | Status: DC

## 2020-04-23 ENCOUNTER — Other Ambulatory Visit: Payer: Self-pay | Admitting: Nurse Practitioner

## 2020-04-23 ENCOUNTER — Telehealth: Payer: Self-pay

## 2020-04-23 DIAGNOSIS — M545 Low back pain, unspecified: Secondary | ICD-10-CM

## 2020-04-23 MED ORDER — CYCLOBENZAPRINE HCL 5 MG PO TABS: 5.0000 mg | ORAL_TABLET | Freq: Two times a day (BID) | ORAL | 1 refills | Status: DC | PRN

## 2020-04-23 NOTE — Telephone Encounter (Signed)
Pt.notified

## 2020-04-23 NOTE — Telephone Encounter (Signed)
Sent prescription for flexeril 5mg  which he can take up to twice daily as needed for muscle pain and spasms. He should not drive or work after he takes this until he knows how it makes him feel. Probably will make him tired. He should put low heat on the back and rest it. Thanks.

## 2020-04-26 ENCOUNTER — Ambulatory Visit (INDEPENDENT_AMBULATORY_CARE_PROVIDER_SITE_OTHER): Payer: 59 | Admitting: Nurse Practitioner

## 2020-04-26 ENCOUNTER — Other Ambulatory Visit: Payer: Self-pay

## 2020-04-26 ENCOUNTER — Encounter: Payer: Self-pay | Admitting: Nurse Practitioner

## 2020-04-26 VITALS — BP 127/83 | HR 90 | Temp 97.6°F | Resp 16 | Ht 71.0 in | Wt 314.0 lb

## 2020-04-26 DIAGNOSIS — Z0001 Encounter for general adult medical examination with abnormal findings: Secondary | ICD-10-CM | POA: Diagnosis not present

## 2020-04-26 DIAGNOSIS — Z23 Encounter for immunization: Secondary | ICD-10-CM

## 2020-04-26 DIAGNOSIS — E1165 Type 2 diabetes mellitus with hyperglycemia: Secondary | ICD-10-CM | POA: Diagnosis not present

## 2020-04-26 DIAGNOSIS — I10 Essential (primary) hypertension: Secondary | ICD-10-CM

## 2020-04-26 DIAGNOSIS — Z794 Long term (current) use of insulin: Secondary | ICD-10-CM

## 2020-04-26 DIAGNOSIS — R300 Dysuria: Secondary | ICD-10-CM | POA: Diagnosis not present

## 2020-04-26 MED ORDER — BASAGLAR KWIKPEN 100 UNIT/ML ~~LOC~~ SOPN: PEN_INJECTOR | SUBCUTANEOUS | 5 refills | Status: DC

## 2020-04-26 NOTE — Progress Notes (Signed)
[facility] [place], [place] [address]  Internal MEDICINE  Office Visit Note  Patient Name: Brett Wiley  [id number]  [id number]  Date of Service: 05/23/2020   Pt is here for routine health maintenance examination  Chief Complaint  Patient presents with  . Annual Exam  . Diabetes  . Hyperlipidemia  . Hypertension  . Quality Metric Gaps    flu,tetnaus  . controlled substance form    reviewed with PT     The patient is here for health maintenance exam. He is here for health maintenance exam. He states that his blood sugars are doing some better. His last HgbA1c check was 02/11/2020 and was 8.1. he had been out of basal insulin for a short period of time. He is now using basaglar at 55 units daily. He is checking his blood sugars prior to each meal. Using regular insulin per sliding scale. He states that there are some occassions when he does not need to have any regular insulin.  Blood pressure is well managed. He is due to have routine, fasting labs. He has had both initial COVID 19 vaccines. Is due for a booster sometime soon. He states that he has no new concerns or complaints today.     Current Medication: Outpatient Encounter Medications as of 04/26/2020  Medication Sig  . cyclobenzaprine (FLEXERIL) 5 MG tablet Take 1 tablet (5 mg total) by mouth 2 (two) times daily as needed for muscle spasms.  Marland Kitchen diltiazem (CARTIA XT) 240 MG 24 hr capsule Take 1 capsule (240 mg total) by mouth every morning.  Marland Kitchen HUMALOG KWIKPEN 100 UNIT/ML KwikPen 2-4 Units Sub 3 times daily PRN, max unit 14 units daily  only if blood sugar is above 100, with meal per sliding scale  . Insulin Glargine (BASAGLAR KWIKPEN) 100 UNIT/ML Inject up to 60 units [place] daily  . Insulin Pen Needle (PEN NEEDLES) 30G X 5 MM MISC 1 each by Does not apply route 4 (four) times daily as needed. E11.65  . lisinopril (ZESTRIL) 2.5 MG tablet Take 1 tablet (2.5 mg total) by mouth daily.  Marland Kitchen lovastatin  (MEVACOR) 20 MG tablet Take 1 tablet (20 mg total) by mouth at bedtime.  . mupirocin ointment (BACTROBAN) 2 % Apply small amount to effected area twice daily for one week.  . Semaglutide, 1 MG/DOSE, (OZEMPIC, 1 MG/DOSE,) 2 MG/1.5ML SOPN Inject 0.75 mLs (1 mg total) into the skin once a week.  . sildenafil (REVATIO) 20 MG tablet Take 1 tablet (20 mg total) by mouth daily as needed.  . [DISCONTINUED] Insulin Glargine (BASAGLAR KWIKPEN) 100 UNIT/ML Inject up to 55 units every evening   No facility-administered encounter medications on file as of 04/26/2020.    Surgical History: Past Surgical History:  Procedure Laterality Date  . COLONOSCOPY WITH PROPOFOL N/A 12/18/2017   Procedure: COLONOSCOPY WITH PROPOFOL;  Surgeon: Jonathon Bellows, MD;  Location: [facility] ENDOSCOPY;  Service: Gastroenterology;  Laterality: N/A;  . RECTAL EXAM UNDER ANESTHESIA N/A 05/24/2018   Procedure: RECTAL EXAM UNDER ANESTHESIA WITH POSSIBLE POLYPECTOMY;  Surgeon: Vickie Epley, MD;  Location: [facility] ORS;  Service: General;  Laterality: N/A;    Medical History: Past Medical History:  Diagnosis Date  . Arthritis   . Atrial fibrillation ([place]) 1993  . Diabetes mellitus without complication ([place])   . History of kidney stones   . Hypercholesteremia   . Hypertension     Family History: Family History  Problem Relation Age of Onset  .  Diabetes Mother   . Diabetes Father   . Hypertension Father       Review of Systems  Constitutional: Negative for activity change, chills, fatigue and unexpected weight change.  HENT: Negative for congestion, postnasal drip, rhinorrhea, sneezing and sore throat.   Respiratory: Negative for cough, chest tightness, shortness of breath and wheezing.   Cardiovascular: Negative for chest pain and palpitations.  Gastrointestinal: Negative for abdominal pain, constipation, diarrhea, nausea and vomiting.  Endocrine: Negative for cold intolerance, heat intolerance, polydipsia and polyuria.        Blood sugars are slightly elevated.   Genitourinary: Negative for dysuria, frequency and urgency.       The patient states that he has skin lesion present around the foreskin of the penis. This acts up from time to time.   Musculoskeletal: Positive for back pain. Negative for arthralgias, joint swelling and neck pain.       Intermittent.   Skin: Negative for rash.  Allergic/Immunologic: Negative for environmental allergies.  Neurological: Negative for dizziness, tremors, numbness and headaches.  Hematological: Negative for adenopathy. Does not bruise/bleed easily.  Psychiatric/Behavioral: Negative for behavioral problems (Depression), sleep disturbance and suicidal ideas. The patient is not nervous/anxious.      Today's Vitals   04/26/20 1017  BP: 127/83  Pulse: 90  Resp: 16  Temp: 97.6 F (36.4 C)  SpO2: 97%  Weight: (!) 314 lb (142.4 kg)  Height: 5\' 11"  (1.803 m)   Body mass index is 43.79 kg/m.  Physical Exam Vitals and nursing note reviewed.  Constitutional:      General: He is not in acute distress.    Appearance: Normal appearance. He is well-developed. He is obese. He is not diaphoretic.  HENT:     Head: Normocephalic and atraumatic.     Mouth/Throat:     Pharynx: No oropharyngeal exudate.  Eyes:     Pupils: Pupils are equal, round, and reactive to light.  Neck:     Thyroid: No thyromegaly.     Vascular: No carotid bruit or JVD.     Trachea: No tracheal deviation.  Cardiovascular:     Rate and Rhythm: Normal rate and regular rhythm.     Pulses:          Dorsalis pedis pulses are 1+ on the right side and 1+ on the left side.       Posterior tibial pulses are 1+ on the right side and 1+ on the left side.     Heart sounds: Normal heart sounds. No murmur heard. No friction rub. No gallop.   Pulmonary:     Effort: Pulmonary effort is normal. No respiratory distress.     Breath sounds: Normal breath sounds. No wheezing or rales.  Chest:     Chest wall: No  tenderness.  Abdominal:     General: Bowel sounds are normal.     Palpations: Abdomen is soft.     Tenderness: There is no abdominal tenderness.  Musculoskeletal:        General: Normal range of motion.     Cervical back: Normal range of motion and neck supple.     Right foot: Normal range of motion. No deformity or bunion.     Left foot: Normal range of motion. No deformity or bunion.  Feet:     Right foot:     Protective Sensation: 10 sites tested. 10 sites sensed.     Skin integrity: Skin integrity normal.     Toenail Condition: Right  toenails are normal.     Left foot:     Protective Sensation: 10 sites tested. 10 sites sensed.     Skin integrity: Skin integrity normal.     Toenail Condition: Left toenails are normal.  Lymphadenopathy:     Cervical: No cervical adenopathy.  Skin:    General: Skin is warm and dry.  Neurological:     General: No focal deficit present.     Mental Status: He is alert and oriented to person, place, and time.     Cranial Nerves: No cranial nerve deficit.  Psychiatric:        Mood and Affect: Mood normal.        Behavior: Behavior normal.        Thought Content: Thought content normal.        Judgment: Judgment normal.      LABS: Recent Results (from the past 2160 hour(s))  UA/M w/rflx Culture, Routine     Status: None   Collection Time: 04/26/20 10:26 AM   Specimen: Urine   Urine  Result Value Ref Range   Specific Gravity, UA 1.021 1.005 - 1.030   pH, UA 5.0 5.0 - 7.5   Color, UA Yellow Yellow   Appearance Ur Clear Clear   Leukocytes,UA Negative Negative   Protein,UA Negative Negative/Trace   Glucose, UA Negative Negative   Ketones, UA Negative Negative   RBC, UA Negative Negative   Bilirubin, UA Negative Negative   Urobilinogen, Ur 0.2 0.2 - 1.0 mg/dL   Nitrite, UA Negative Negative   Microscopic Examination Comment     Comment: Microscopic follows if indicated.   Microscopic Examination See below:     Comment: Microscopic was  indicated and was performed.   Urinalysis Reflex Comment     Comment: This specimen will not reflex to a Urine Culture.  Microscopic Examination     Status: None   Collection Time: 04/26/20 10:26 AM   Urine  Result Value Ref Range   WBC, UA 0-5 0 - 5 /hpf   RBC None seen 0 - 2 /hpf   Epithelial Cells (non renal) 0-10 0 - 10 /hpf   Casts None seen None seen /lpf   Bacteria, UA None seen None seen/Few    Assessment/Plan: 1. Encounter for general adult medical examination with abnormal findings Annual health maintenance exam today. Order slip given to have routine, fasting labs drawn.   2. Type 2 diabetes mellitus with hyperglycemia, with [place]-term current use of insulin (HCC) Blood sugars continue to be slightly elevated. Increase basaglar to 60 units daily. Check blood sugars prior to each meal and dose regular insulin based on sliding scale. Recheck HgbA1c at next visit . - Insulin Glargine (BASAGLAR KWIKPEN) 100 UNIT/ML; Inject up to 60 units [place] daily  Dispense: 30 mL; Refill: 5  3. Essential hypertension Stable. Continue bp medication as prescribed   4. Dysuria - UA/M w/rflx Culture, Routine  5. Needs flu shot Fl vaccine administered in the office today.  - Flu Vaccine MDCK QUAD PF  General Counseling: Srihan verbalizes understanding of the findings of todays visit and agrees with plan of treatment. I have discussed any further diagnostic evaluation that may be needed or ordered today. We also reviewed his medications today. he has been encouraged to call the office with any questions or concerns that should arise related to todays visit.    Counseling:  Diabetes Counseling:  1. Addition of ACE inh/ ARB'S for nephroprotection. Microalbumin is updated  2.  Diabetic foot care, prevention of complications. Podiatry consult 3. Exercise and lose weight.  4. Diabetic eye examination, Diabetic eye exam is updated  5. Monitor blood sugar closlely. nutrition counseling.  6. Sign  and symptoms of hypoglycemia including shaking sweating,confusion and headaches.  This patient was seen by Leretha Pol FNP Collaboration with Dr Lavera Guise as a part of collaborative care agreement  Orders Placed This Encounter  Procedures  . Microscopic Examination  . Flu Vaccine MDCK QUAD PF  . UA/M w/rflx Culture, Routine    Meds ordered this encounter  Medications  . Insulin Glargine (BASAGLAR KWIKPEN) 100 UNIT/ML    Sig: Inject up to 60 units [place] daily    Dispense:  30 mL    Refill:  5    Patient needs enough pens to last him 30 days. Increased dose to 60 units    Order Specific Question:   Supervising Provider    Answer:   Lavera Guise [[id number]]    Total time spent: 16 Minutes  Time spent includes review of chart, medications, test results, and follow up plan with the patient.     Lavera Guise, MD  Internal Medicine

## 2020-04-27 LAB — MICROSCOPIC EXAMINATION
Bacteria, UA: NONE SEEN
Casts: NONE SEEN /lpf
RBC, Urine: NONE SEEN /hpf (ref 0–2)

## 2020-04-27 LAB — UA/M W/RFLX CULTURE, ROUTINE
Bilirubin, UA: NEGATIVE
Glucose, UA: NEGATIVE
Ketones, UA: NEGATIVE
Leukocytes,UA: NEGATIVE
Nitrite, UA: NEGATIVE
Protein,UA: NEGATIVE
RBC, UA: NEGATIVE
Specific Gravity, UA: 1.021 (ref 1.005–1.030)
Urobilinogen, Ur: 0.2 mg/dL (ref 0.2–1.0)
pH, UA: 5 (ref 5.0–7.5)

## 2020-05-23 DIAGNOSIS — Z23 Encounter for immunization: Secondary | ICD-10-CM | POA: Insufficient documentation

## 2020-05-24 ENCOUNTER — Other Ambulatory Visit: Payer: Self-pay

## 2020-05-24 DIAGNOSIS — Z794 Long term (current) use of insulin: Secondary | ICD-10-CM

## 2020-05-24 DIAGNOSIS — E1165 Type 2 diabetes mellitus with hyperglycemia: Secondary | ICD-10-CM

## 2020-05-24 MED ORDER — PEN NEEDLES 30G X 5 MM MISC: 1.0000 | Freq: Four times a day (QID) | 5 refills | Status: DC | PRN

## 2020-07-19 ENCOUNTER — Ambulatory Visit: Payer: 59 | Admitting: Hospice and Palliative Medicine

## 2020-07-28 ENCOUNTER — Other Ambulatory Visit: Payer: Self-pay

## 2020-07-28 MED ORDER — LOVASTATIN 20 MG PO TABS
20.0000 mg | ORAL_TABLET | Freq: Every day | ORAL | 0 refills | Status: DC
Start: 2020-07-28 — End: 2021-02-10

## 2020-08-23 ENCOUNTER — Ambulatory Visit: Payer: 59 | Admitting: Physician Assistant

## 2020-08-23 ENCOUNTER — Encounter: Payer: Self-pay | Admitting: Physician Assistant

## 2020-08-23 ENCOUNTER — Other Ambulatory Visit: Payer: Self-pay

## 2020-08-23 DIAGNOSIS — E1165 Type 2 diabetes mellitus with hyperglycemia: Secondary | ICD-10-CM | POA: Diagnosis not present

## 2020-08-23 DIAGNOSIS — Z794 Long term (current) use of insulin: Secondary | ICD-10-CM | POA: Diagnosis not present

## 2020-08-23 DIAGNOSIS — Z6841 Body Mass Index (BMI) 40.0 and over, adult: Secondary | ICD-10-CM

## 2020-08-23 DIAGNOSIS — I10 Essential (primary) hypertension: Secondary | ICD-10-CM

## 2020-08-23 DIAGNOSIS — R5383 Other fatigue: Secondary | ICD-10-CM

## 2020-08-23 LAB — POCT GLYCOSYLATED HEMOGLOBIN (HGB A1C): Hemoglobin A1C: 7.3 % — AB (ref 4.0–5.6)

## 2020-08-23 MED ORDER — ONETOUCH DELICA LANCETS 30G MISC: 3 refills | Status: AC

## 2020-08-23 NOTE — Progress Notes (Signed)
[facility] [place], [place] [address]  Internal MEDICINE  Office Visit Note  Patient Name: Brett Wiley  [id number]  [id number]  Date of Service: 08/24/2020  Chief Complaint  Patient presents with  . Follow-up    Refill request, discuss weight  . Diabetes  . Hyperlipidemia  . Hypertension  . Quality Metric Gaps    Eye exam scheduled    HPI Pt is here for f/u -BG 97-100, sometimes 120-150; varies with what he ate the night before. Takes 65 units of basaglar; sliding scale with meals. Most at meal time is 4 units when 140-180; below that does 2 units. Ozempic on Mondays. Due for A1c today and needs lancets refilled. -BP good today, still take lisinopril and diltiazem, he doesn't check at home. -Eye exam a few months ago and was told he needed glasses with reading and goes back this Friday for f/u. -Pt never went for labs, but still has lab slip. He promised to go now.  -Pt has put on wt, he is going to start walking daily now that it is light out later when he gets done with work. Works in [employer] and is on his feet all day.  Current Medication: Outpatient Encounter Medications as of 08/23/2020  Medication Sig  . [DISCONTINUED] OneTouch Delica Lancets 07P MISC by Does not apply route. Use as directed 4 times a daily Dx E11.65  . cyclobenzaprine (FLEXERIL) 5 MG tablet Take 1 tablet (5 mg total) by mouth 2 (two) times daily as needed for muscle spasms.  Marland Kitchen diltiazem (CARTIA XT) 240 MG 24 hr capsule Take 1 capsule (240 mg total) by mouth every morning.  Marland Kitchen HUMALOG KWIKPEN 100 UNIT/ML KwikPen 2-4 Units Sub 3 times daily PRN, max unit 14 units daily  only if blood sugar is above 100, with meal per sliding scale  . Insulin Glargine (BASAGLAR KWIKPEN) 100 UNIT/ML Inject up to 60 units [place] daily  . Insulin Pen Needle (PEN NEEDLES) 30G X 5 MM MISC 1 each by Does not apply route 4 (four) times daily as needed. E11.65  . lisinopril (ZESTRIL) 2.5 MG tablet Take 1  tablet (2.5 mg total) by mouth daily.  Marland Kitchen lovastatin (MEVACOR) 20 MG tablet Take 1 tablet (20 mg total) by mouth at bedtime.  . mupirocin ointment (BACTROBAN) 2 % Apply small amount to effected area twice daily for one week.  . Semaglutide, 1 MG/DOSE, (OZEMPIC, 1 MG/DOSE,) 2 MG/1.5ML SOPN Inject 0.75 mLs (1 mg total) into the skin once a week.  . sildenafil (REVATIO) 20 MG tablet Take 1 tablet (20 mg total) by mouth daily as needed.   No facility-administered encounter medications on file as of 08/23/2020.    Surgical History: Past Surgical History:  Procedure Laterality Date  . COLONOSCOPY WITH PROPOFOL N/A 12/18/2017   Procedure: COLONOSCOPY WITH PROPOFOL;  Surgeon: Jonathon Bellows, MD;  Location: [facility] ENDOSCOPY;  Service: Gastroenterology;  Laterality: N/A;  . RECTAL EXAM UNDER ANESTHESIA N/A 05/24/2018   Procedure: RECTAL EXAM UNDER ANESTHESIA WITH POSSIBLE POLYPECTOMY;  Surgeon: Vickie Epley, MD;  Location: [facility] ORS;  Service: General;  Laterality: N/A;    Medical History: Past Medical History:  Diagnosis Date  . Arthritis   . Atrial fibrillation ([place]) 1993  . Diabetes mellitus without complication ([place])   . History of kidney stones   . Hypercholesteremia   . Hypertension     Family History: Family History  Problem Relation Age of Onset  . Diabetes Mother   .  Diabetes Father   . Hypertension Father     Social History   Socioeconomic History  . Marital status: Single    Spouse name: Not on file  . Number of children: Not on file  . Years of education: Not on file  . Highest education level: Not on file  Occupational History  . Not on file  Tobacco Use  . Smoking status: Never Smoker  . Smokeless tobacco: Never Used  Vaping Use  . Vaping Use: Never used  Substance and Sexual Activity  . Alcohol use: No    Alcohol/week: 0.0 standard drinks  . Drug use: No  . Sexual activity: Not on file  Other Topics Concern  . Not on file  Social History Narrative  . Not on  file   Social Determinants of Health   Financial Resource Strain: Not on file  Food Insecurity: Not on file  Transportation Needs: Not on file  Physical Activity: Not on file  Stress: Not on file  Social Connections: Not on file  Intimate Partner Violence: Not on file      Review of Systems  Constitutional: Positive for unexpected weight change. Negative for chills and fatigue.  HENT: Negative for congestion, postnasal drip, rhinorrhea, sneezing and sore throat.   Eyes: Positive for visual disturbance. Negative for redness.  Respiratory: Negative for cough, chest tightness and shortness of breath.   Cardiovascular: Negative for chest pain and palpitations.  Gastrointestinal: Negative for abdominal pain, constipation, diarrhea, nausea and vomiting.  Genitourinary: Negative for dysuria and frequency.  Musculoskeletal: Negative for arthralgias, back pain, joint swelling and neck pain.  Skin: Negative for rash.  Neurological: Negative.  Negative for tremors, numbness and headaches.  Hematological: Negative for adenopathy. Does not bruise/bleed easily.  Psychiatric/Behavioral: Negative for behavioral problems (Depression), sleep disturbance and suicidal ideas. The patient is not nervous/anxious.     Vital Signs: BP 132/84   Pulse 82   Temp (!) 97.2 F (36.2 C)   Resp 16   Ht 5' 11.5" (1.816 m)   Wt (!) 319 lb 3.2 oz (144.8 kg)   SpO2 97%   BMI 43.90 kg/m    Physical Exam Constitutional:      General: He is not in acute distress.    Appearance: He is well-developed. He is obese. He is not diaphoretic.  HENT:     Head: Normocephalic and atraumatic.     Mouth/Throat:     Pharynx: No oropharyngeal exudate.  Eyes:     Pupils: Pupils are equal, round, and reactive to light.  Neck:     Thyroid: No thyromegaly.     Vascular: No JVD.     Trachea: No tracheal deviation.  Cardiovascular:     Rate and Rhythm: Normal rate and regular rhythm.     Heart sounds: Normal heart  sounds. No murmur heard. No friction rub. No gallop.   Pulmonary:     Effort: Pulmonary effort is normal. No respiratory distress.     Breath sounds: No wheezing or rales.  Chest:     Chest wall: No tenderness.  Abdominal:     General: Bowel sounds are normal.     Palpations: Abdomen is soft.  Musculoskeletal:        General: Normal range of motion.     Cervical back: Normal range of motion and neck supple.  Lymphadenopathy:     Cervical: No cervical adenopathy.  Skin:    General: Skin is warm and dry.  Neurological:  Mental Status: He is alert and oriented to person, place, and time.     Cranial Nerves: No cranial nerve deficit.  Psychiatric:        Behavior: Behavior normal.        Thought Content: Thought content normal.        Judgment: Judgment normal.        Assessment/Plan: 1. Type 2 diabetes mellitus with hyperglycemia, with Reagor-term current use of insulin (HCC) - POCT HgB A1C is 7.3 today which is improved from last visit. Will continue current medications and pt will continue to monitor BG at home and work on diet and exercise.  2. Essential hypertension Well controlled. Continue lisinopril and diltiazem.  3. Morbid obesity with BMI of 40.0-44.9, adult ([place]) Obesity Counseling: Had a lengthy discussion regarding patients BMI and weight issues. Patient was instructed on portion control as well as increased activity. Also discussed caloric restrictions with trying to maintain intake less than 2000 Kcal. Discussions were made in accordance with the 5As of weight management. Simple actions such as not eating late and if able to, taking a walk is suggested.  4. Other fatigue Pt has lab slip and will go for blood work.   General Counseling: Ahmir verbalizes understanding of the findings of todays visit and agrees with plan of treatment. I have discussed any further diagnostic evaluation that may be needed or ordered today. We also reviewed his medications today.  he has been encouraged to call the office with any questions or concerns that should arise related to todays visit.    Orders Placed This Encounter  Procedures  . POCT HgB A1C    No orders of the defined types were placed in this encounter.   This patient was seen by Drema Dallas, PA-C in collaboration with Dr. Clayborn Bigness as a part of collaborative care agreement.   Total time spent:30 Minutes Time spent includes review of chart, medications, test results, and follow up plan with the patient.      Dr Lavera Guise Internal medicine

## 2020-09-27 ENCOUNTER — Other Ambulatory Visit: Payer: Self-pay | Admitting: Nurse Practitioner

## 2020-09-27 DIAGNOSIS — I10 Essential (primary) hypertension: Secondary | ICD-10-CM

## 2020-10-16 ENCOUNTER — Other Ambulatory Visit: Payer: Self-pay | Admitting: Nurse Practitioner

## 2020-10-16 DIAGNOSIS — I10 Essential (primary) hypertension: Secondary | ICD-10-CM

## 2020-10-19 ENCOUNTER — Other Ambulatory Visit: Payer: Self-pay

## 2020-10-19 DIAGNOSIS — I10 Essential (primary) hypertension: Secondary | ICD-10-CM

## 2020-10-19 MED ORDER — LISINOPRIL 2.5 MG PO TABS: 2.5000 mg | ORAL_TABLET | Freq: Every day | ORAL | 5 refills | Status: DC

## 2020-10-19 MED ORDER — DILTIAZEM HCL ER COATED BEADS 240 MG PO CP24: 240.0000 mg | ORAL_CAPSULE | ORAL | 5 refills | Status: DC

## 2020-11-25 ENCOUNTER — Ambulatory Visit: Payer: 59 | Admitting: Physician Assistant

## 2021-02-08 ENCOUNTER — Other Ambulatory Visit: Payer: Self-pay | Admitting: Nurse Practitioner

## 2021-02-08 DIAGNOSIS — E1165 Type 2 diabetes mellitus with hyperglycemia: Secondary | ICD-10-CM

## 2021-02-09 ENCOUNTER — Other Ambulatory Visit: Payer: Self-pay

## 2021-02-09 DIAGNOSIS — E1165 Type 2 diabetes mellitus with hyperglycemia: Secondary | ICD-10-CM

## 2021-02-09 MED ORDER — HUMALOG KWIKPEN 100 UNIT/ML ~~LOC~~ SOPN: PEN_INJECTOR | SUBCUTANEOUS | 0 refills | Status: DC

## 2021-02-09 MED ORDER — BASAGLAR KWIKPEN 100 UNIT/ML ~~LOC~~ SOPN: PEN_INJECTOR | SUBCUTANEOUS | 0 refills | Status: DC

## 2021-02-09 MED ORDER — OZEMPIC (1 MG/DOSE) 2 MG/1.5ML ~~LOC~~ SOPN: 1.0000 mg | PEN_INJECTOR | SUBCUTANEOUS | 0 refills | Status: DC

## 2021-02-10 ENCOUNTER — Encounter: Payer: Self-pay | Admitting: Nurse Practitioner

## 2021-02-10 ENCOUNTER — Other Ambulatory Visit: Payer: Self-pay

## 2021-02-10 ENCOUNTER — Ambulatory Visit: Payer: 59 | Admitting: Nurse Practitioner

## 2021-02-10 VITALS — BP 132/90 | HR 84 | Temp 98.3°F | Resp 16 | Ht 71.5 in | Wt 319.6 lb

## 2021-02-10 DIAGNOSIS — E1165 Type 2 diabetes mellitus with hyperglycemia: Secondary | ICD-10-CM | POA: Diagnosis not present

## 2021-02-10 DIAGNOSIS — E782 Mixed hyperlipidemia: Secondary | ICD-10-CM

## 2021-02-10 DIAGNOSIS — N528 Other male erectile dysfunction: Secondary | ICD-10-CM

## 2021-02-10 DIAGNOSIS — Z23 Encounter for immunization: Secondary | ICD-10-CM

## 2021-02-10 DIAGNOSIS — Z794 Long term (current) use of insulin: Secondary | ICD-10-CM | POA: Diagnosis not present

## 2021-02-10 DIAGNOSIS — I10 Essential (primary) hypertension: Secondary | ICD-10-CM | POA: Diagnosis not present

## 2021-02-10 LAB — POCT GLYCOSYLATED HEMOGLOBIN (HGB A1C): Hemoglobin A1C: 8.1 % — AB (ref 4.0–5.6)

## 2021-02-10 MED ORDER — ZOSTER VAC RECOMB ADJUVANTED 50 MCG/0.5ML IM SUSR: 0.5000 mL | Freq: Once | INTRAMUSCULAR | 0 refills | Status: AC

## 2021-02-10 MED ORDER — DAPAGLIFLOZIN PROPANEDIOL 10 MG PO TABS: 10.0000 mg | ORAL_TABLET | Freq: Every day | ORAL | 2 refills | Status: DC

## 2021-02-10 MED ORDER — SILDENAFIL CITRATE 20 MG PO TABS: 20.0000 mg | ORAL_TABLET | Freq: Every day | ORAL | 0 refills | Status: DC | PRN

## 2021-02-10 MED ORDER — LOVASTATIN 20 MG PO TABS: 20.0000 mg | ORAL_TABLET | Freq: Every day | ORAL | 0 refills | Status: DC

## 2021-02-10 MED ORDER — BASAGLAR KWIKPEN 100 UNIT/ML ~~LOC~~ SOPN: PEN_INJECTOR | SUBCUTANEOUS | 1 refills | Status: DC

## 2021-02-10 MED ORDER — OZEMPIC (1 MG/DOSE) 2 MG/1.5ML ~~LOC~~ SOPN: 1.0000 mg | PEN_INJECTOR | SUBCUTANEOUS | 2 refills | Status: DC

## 2021-02-10 MED ORDER — TETANUS-DIPHTH-ACELL PERTUSSIS 5-2.5-18.5 LF-MCG/0.5 IM SUSP: 0.5000 mL | Freq: Once | INTRAMUSCULAR | 0 refills | Status: DC

## 2021-02-10 NOTE — Progress Notes (Signed)
[facility] [place], [place] [address]  Internal MEDICINE  Office Visit Note  Patient Name: Brett Wiley  [id number]  [id number]  Date of Service: 02/10/2021  Chief Complaint  Patient presents with   Follow-up    follow up for DM for HTN, discuss meds   Depression   Hypertension   Hyperlipidemia    HPI Brett Wiley presents for a follow up visit for diabetes, hypertension and medication review. A1C is elevated at 8.1. His A1C was 7.3 in march 2022. He is taking lantus, ozempic and farxiga for diabetes. His blood pressure is well controlled with current medication. He is taking lovastatin for hyperlipidemia. He is also requesting refills.  Has been on farxiga before but does not remember why he was taken off of it.    Current Medication: Outpatient Encounter Medications as of 02/10/2021  Medication Sig   HUMALOG KWIKPEN 100 UNIT/ML KwikPen 2-4 Units Sub 3 times daily PRN, max unit 14 units daily  only if blood sugar is above 100, with meal per sliding scale   mupirocin ointment (BACTROBAN) 2 % Apply small amount to effected area twice daily for one week.   OneTouch Delica Lancets 99991111 MISC Use as directed 4 times a daily Dx E11.65   [DISCONTINUED] cyclobenzaprine (FLEXERIL) 5 MG tablet Take 1 tablet (5 mg total) by mouth 2 (two) times daily as needed for muscle spasms.   [DISCONTINUED] dapagliflozin propanediol (FARXIGA) 10 MG TABS tablet Take 1 tablet (10 mg total) by mouth daily before breakfast.   [DISCONTINUED] diltiazem (CARTIA XT) 240 MG 24 hr capsule Take 1 capsule (240 mg total) by mouth every morning.   [DISCONTINUED] Insulin Pen Needle (PEN NEEDLES) 30G X 5 MM MISC 1 each by Does not apply route 4 (four) times daily as needed. E11.65   [DISCONTINUED] lisinopril (ZESTRIL) 2.5 MG tablet Take 1 tablet (2.5 mg total) by mouth daily.   [DISCONTINUED] lovastatin (MEVACOR) 20 MG tablet Take 1 tablet (20 mg total) by mouth at bedtime.   [DISCONTINUED]  Semaglutide, 1 MG/DOSE, (OZEMPIC, 1 MG/DOSE,) 2 MG/1.5ML SOPN Inject 1 mg into the skin once a week.   [DISCONTINUED] sildenafil (REVATIO) 20 MG tablet Take 1 tablet (20 mg total) by mouth daily as needed.   [DISCONTINUED] Tdap (BOOSTRIX) 5-2.5-18.5 LF-MCG/0.5 injection Inject into the muscle once.   [DISCONTINUED] Zoster Vaccine Adjuvanted [facility]) injection Inject 0.5 mLs into the muscle once.   lovastatin (MEVACOR) 20 MG tablet Take 1 tablet (20 mg total) by mouth at bedtime.   Semaglutide, 1 MG/DOSE, (OZEMPIC, 1 MG/DOSE,) 2 MG/1.5ML SOPN Inject 1 mg into the skin once a week.   sildenafil (REVATIO) 20 MG tablet Take 1 tablet (20 mg total) by mouth daily as needed.   Tdap (BOOSTRIX) 5-2.5-18.5 LF-MCG/0.5 injection Inject 0.5 mLs into the muscle once for 1 dose.   [EXPIRED] Zoster Vaccine Adjuvanted [facility]) injection Inject 0.5 mLs into the muscle once for 1 dose.   [DISCONTINUED] Insulin Glargine (BASAGLAR KWIKPEN) 100 UNIT/ML Inject up to 60 units [place] daily   [DISCONTINUED] Insulin Glargine (BASAGLAR KWIKPEN) 100 UNIT/ML Inject up to 60 units [place] daily   No facility-administered encounter medications on file as of 02/10/2021.    Surgical History: Past Surgical History:  Procedure Laterality Date   COLONOSCOPY WITH PROPOFOL N/A 12/18/2017   Procedure: COLONOSCOPY WITH PROPOFOL;  Surgeon: Jonathon Bellows, MD;  Location: [facility] ENDOSCOPY;  Service: Gastroenterology;  Laterality: N/A;   RECTAL EXAM UNDER ANESTHESIA N/A 05/24/2018   Procedure: RECTAL  EXAM UNDER ANESTHESIA WITH POSSIBLE POLYPECTOMY;  Surgeon: Vickie Epley, MD;  Location: [facility] ORS;  Service: General;  Laterality: N/A;    Medical History: Past Medical History:  Diagnosis Date   Arthritis    Atrial fibrillation ([place]) 1993   Diabetes mellitus without complication ([place])    History of kidney stones    Hypercholesteremia    Hypertension     Family History: Family History  Problem Relation Age of Onset   Diabetes Mother     Diabetes Father    Hypertension Father     Social History   Socioeconomic History   Marital status: Single    Spouse name: Not on file   Number of children: Not on file   Years of education: Not on file   Highest education level: Not on file  Occupational History   Not on file  Tobacco Use   Smoking status: Never   Smokeless tobacco: Never  Vaping Use   Vaping Use: Never used  Substance and Sexual Activity   Alcohol use: No    Alcohol/week: 0.0 standard drinks   Drug use: No   Sexual activity: Not on file  Other Topics Concern   Not on file  Social History Narrative   Not on file   Social Determinants of Health   Financial Resource Strain: Not on file  Food Insecurity: Not on file  Transportation Needs: Not on file  Physical Activity: Not on file  Stress: Not on file  Social Connections: Not on file  Intimate Partner Violence: Not on file      Review of Systems  Constitutional:  Negative for chills, fatigue and unexpected weight change.  HENT:  Negative for congestion, rhinorrhea, sneezing and sore throat.   Eyes:  Negative for redness.  Respiratory:  Negative for cough, chest tightness and shortness of breath.   Cardiovascular:  Negative for chest pain and palpitations.  Gastrointestinal:  Negative for abdominal pain, constipation, diarrhea, nausea and vomiting.  Genitourinary:  Negative for dysuria and frequency.  Musculoskeletal:  Negative for arthralgias, back pain, joint swelling and neck pain.  Skin:  Negative for rash.  Neurological: Negative.  Negative for tremors and numbness.  Hematological:  Negative for adenopathy. Does not bruise/bleed easily.  Psychiatric/Behavioral:  Negative for behavioral problems (Depression), sleep disturbance and suicidal ideas. The patient is not nervous/anxious.    Vital Signs: BP 132/90   Pulse 84   Temp 98.3 F (36.8 C)   Resp 16   Ht 5' 11.5" (1.816 m)   Wt (!) 319 lb 9.6 oz (145 kg)   SpO2 97%   BMI 43.95  kg/m    Physical Exam Vitals reviewed.  Constitutional:      General: He is not in acute distress.    Appearance: Normal appearance. He is obese. He is not ill-appearing.  HENT:     Head: Normocephalic and atraumatic.  Eyes:     Extraocular Movements: Extraocular movements intact.     Pupils: Pupils are equal, round, and reactive to light.  Cardiovascular:     Rate and Rhythm: Normal rate and regular rhythm.  Pulmonary:     Effort: Pulmonary effort is normal. No respiratory distress.  Neurological:     Mental Status: He is alert and oriented to person, place, and time.     Cranial Nerves: No cranial nerve deficit.     Coordination: Coordination normal.     Gait: Gait normal.  Psychiatric:        Mood  and Affect: Mood normal.        Behavior: Behavior normal.     Assessment/Plan: 1. Type 2 diabetes mellitus with hyperglycemia, with [place]-term current use of insulin (HCC) Ozempic dose increased  to 1 mg weekly. Follow up in 3 months to check A1C. Provided samples of farxiga. Prescribed to patient as well to continue after the samples are gone. - Semaglutide, 1 MG/DOSE, (OZEMPIC, 1 MG/DOSE,) 2 MG/1.5ML SOPN; Inject 1 mg into the skin once a week.  Dispense: 6 mL; Refill: 2 - POCT HgB A1C  2. Essential hypertension Stable, continue medication as prescribed.   3. Mixed hyperlipidemia Stable, refill ordered.  - lovastatin (MEVACOR) 20 MG tablet; Take 1 tablet (20 mg total) by mouth at bedtime.  Dispense: 90 tablet; Refill: 0  4. Other male erectile dysfunction Refill ordered.  - sildenafil (REVATIO) 20 MG tablet; Take 1 tablet (20 mg total) by mouth daily as needed.  Dispense: 30 tablet; Refill: 0  5. Need for vaccination - Zoster Vaccine Adjuvanted [facility]) injection; Inject 0.5 mLs into the muscle once for 1 dose.  Dispense: 0.5 mL; Refill: 0 - Tdap (BOOSTRIX) 5-2.5-18.5 LF-MCG/0.5 injection; Inject 0.5 mLs into the muscle once for 1 dose.  Dispense: 0.5 mL; Refill:  0   General Counseling: Eugenio verbalizes understanding of the findings of todays visit and agrees with plan of treatment. I have discussed any further diagnostic evaluation that may be needed or ordered today. We also reviewed his medications today. he has been encouraged to call the office with any questions or concerns that should arise related to todays visit.    Orders Placed This Encounter  Procedures   POCT HgB A1C    Meds ordered this encounter  Medications   Zoster Vaccine Adjuvanted [facility]) injection    Sig: Inject 0.5 mLs into the muscle once for 1 dose.    Dispense:  0.5 mL    Refill:  0   Tdap (BOOSTRIX) 5-2.5-18.5 LF-MCG/0.5 injection    Sig: Inject 0.5 mLs into the muscle once for 1 dose.    Dispense:  0.5 mL    Refill:  0   DISCONTD: Insulin Glargine (BASAGLAR KWIKPEN) 100 UNIT/ML    Sig: Inject up to 60 units [place] daily    Dispense:  30 mL    Refill:  1    Patient needs enough pens to last him 30 days. Increased dose to 60 units   Semaglutide, 1 MG/DOSE, (OZEMPIC, 1 MG/DOSE,) 2 MG/1.5ML SOPN    Sig: Inject 1 mg into the skin once a week.    Dispense:  6 mL    Refill:  2   lovastatin (MEVACOR) 20 MG tablet    Sig: Take 1 tablet (20 mg total) by mouth at bedtime.    Dispense:  90 tablet    Refill:  0    Pt need appt  For refills   sildenafil (REVATIO) 20 MG tablet    Sig: Take 1 tablet (20 mg total) by mouth daily as needed.    Dispense:  30 tablet    Refill:  0   DISCONTD: dapagliflozin propanediol (FARXIGA) 10 MG TABS tablet    Sig: Take 1 tablet (10 mg total) by mouth daily before breakfast.    Dispense:  30 tablet    Refill:  2    Return in about 1 month (around 03/12/2021) for F/U eval new med Encarnacion Slates PCP.   Total time spent:30 Minutes Time spent includes review of chart, medications,  test results, and follow up plan with the patient.    Controlled Substance Database was reviewed by me.  This patient was seen by Jonetta Osgood,  FNP-C in collaboration with Dr. Clayborn Bigness as a part of collaborative care agreement.   Branna Cortina R. Valetta Fuller, MSN, FNP-C Internal medicine

## 2021-02-15 ENCOUNTER — Other Ambulatory Visit: Payer: Self-pay

## 2021-02-15 DIAGNOSIS — E1165 Type 2 diabetes mellitus with hyperglycemia: Secondary | ICD-10-CM

## 2021-02-15 MED ORDER — PEN NEEDLES 30G X 5 MM MISC: 1.0000 | Freq: Four times a day (QID) | 5 refills | Status: DC | PRN

## 2021-02-15 MED ORDER — INSULIN GLARGINE 100 UNIT/ML SOLOSTAR PEN: 60.0000 [IU] | PEN_INJECTOR | Freq: Every day | SUBCUTANEOUS | 11 refills | Status: DC

## 2021-02-15 NOTE — Telephone Encounter (Signed)
Spoke to [facility] at [facility] and changed [employer] to [employer] due to patients insurance doesn't cover the basaglar anymore.  Lantus and basaglar are same type of insulin

## 2021-02-16 ENCOUNTER — Telehealth: Payer: Self-pay

## 2021-02-16 MED ORDER — DAPAGLIFLOZIN PROPANEDIOL 10 MG PO TABS: 10.0000 mg | ORAL_TABLET | Freq: Every day | ORAL | 2 refills | Status: DC

## 2021-02-16 NOTE — Telephone Encounter (Signed)
PA sent to [facility] for FARXIGA 10 mg

## 2021-02-16 NOTE — Telephone Encounter (Signed)
PA for FARXIGA 10 mg was approved 02/16/21 to 02/16/2022.  New rx sent to pharmacy

## 2021-02-24 ENCOUNTER — Other Ambulatory Visit: Payer: Self-pay | Admitting: Physician Assistant

## 2021-02-24 DIAGNOSIS — I10 Essential (primary) hypertension: Secondary | ICD-10-CM

## 2021-03-10 ENCOUNTER — Ambulatory Visit: Payer: 59 | Admitting: Nurse Practitioner

## 2021-03-10 ENCOUNTER — Encounter: Payer: Self-pay | Admitting: Nurse Practitioner

## 2021-03-10 ENCOUNTER — Other Ambulatory Visit: Payer: Self-pay

## 2021-03-10 VITALS — BP 124/88 | HR 80 | Temp 98.3°F | Resp 16 | Ht 71.5 in | Wt 314.6 lb

## 2021-03-10 DIAGNOSIS — Z794 Long term (current) use of insulin: Secondary | ICD-10-CM | POA: Diagnosis not present

## 2021-03-10 DIAGNOSIS — E1165 Type 2 diabetes mellitus with hyperglycemia: Secondary | ICD-10-CM

## 2021-03-10 DIAGNOSIS — Z6841 Body Mass Index (BMI) 40.0 and over, adult: Secondary | ICD-10-CM

## 2021-03-10 DIAGNOSIS — I10 Essential (primary) hypertension: Secondary | ICD-10-CM

## 2021-03-10 NOTE — Progress Notes (Signed)
[facility] [place], [place] [address]  Internal MEDICINE  Office Visit Note  Patient Name: Brett Wiley  [id number]  [id number]  Date of Service: 03/10/2021  Chief Complaint  Patient presents with   Follow-up    Discuss meds   Diabetes   Hyperlipidemia   Hypertension    HPI Kalieb presents for a follow up visit for a follow up visit for diabetes and medication review. His A1C went up at his previous office visit. He was started on farxiga at that time. He reports that his glucose levels have started to improve and he has already lost 5 lbs. He denies any adverse side effects. He also continues to take ozempic 1 mg weekly and is having no problems with this either.  Blood pressure is appropriate today.     Current Medication: Outpatient Encounter Medications as of 03/10/2021  Medication Sig   dapagliflozin propanediol (FARXIGA) 10 MG TABS tablet Take 1 tablet (10 mg total) by mouth daily before breakfast.   diltiazem (CARDIZEM CD) 240 MG 24 hr capsule TAKE ONE CAPSULE BY MOUTH EVERY MORNING   HUMALOG KWIKPEN 100 UNIT/ML KwikPen 2-4 Units Sub 3 times daily PRN, max unit 14 units daily  only if blood sugar is above 100, with meal per sliding scale   insulin glargine (LANTUS) 100 UNIT/ML Solostar Pen Inject 60 Units into the skin daily.   Insulin Pen Needle (PEN NEEDLES) 30G X 5 MM MISC 1 each by Does not apply route 4 (four) times daily as needed. E11.65   lisinopril (ZESTRIL) 2.5 MG tablet TAKE ONE TABLET BY MOUTH DAILY   lovastatin (MEVACOR) 20 MG tablet Take 1 tablet (20 mg total) by mouth at bedtime.   mupirocin ointment (BACTROBAN) 2 % Apply small amount to effected area twice daily for one week.   OneTouch Delica Lancets 17P MISC Use as directed 4 times a daily Dx E11.65   Semaglutide, 1 MG/DOSE, (OZEMPIC, 1 MG/DOSE,) 2 MG/1.5ML SOPN Inject 1 mg into the skin once a week.   sildenafil (REVATIO) 20 MG tablet Take 1 tablet (20 mg total) by mouth  daily as needed.   Tdap (BOOSTRIX) 5-2.5-18.5 LF-MCG/0.5 injection Inject 0.5 mLs into the muscle once for 1 dose.   No facility-administered encounter medications on file as of 03/10/2021.    Surgical History: Past Surgical History:  Procedure Laterality Date   COLONOSCOPY WITH PROPOFOL N/A 12/18/2017   Procedure: COLONOSCOPY WITH PROPOFOL;  Surgeon: Jonathon Bellows, MD;  Location: [facility] ENDOSCOPY;  Service: Gastroenterology;  Laterality: N/A;   RECTAL EXAM UNDER ANESTHESIA N/A 05/24/2018   Procedure: RECTAL EXAM UNDER ANESTHESIA WITH POSSIBLE POLYPECTOMY;  Surgeon: Vickie Epley, MD;  Location: [facility] ORS;  Service: General;  Laterality: N/A;    Medical History: Past Medical History:  Diagnosis Date   Arthritis    Atrial fibrillation ([place]) 1993   Diabetes mellitus without complication ([place])    History of kidney stones    Hypercholesteremia    Hypertension     Family History: Family History  Problem Relation Age of Onset   Diabetes Mother    Diabetes Father    Hypertension Father     Social History   Socioeconomic History   Marital status: Single    Spouse name: Not on file   Number of children: Not on file   Years of education: Not on file   Highest education level: Not on file  Occupational History   Not on file  Tobacco Use  Smoking status: Never   Smokeless tobacco: Never  Vaping Use   Vaping Use: Never used  Substance and Sexual Activity   Alcohol use: No    Alcohol/week: 0.0 standard drinks   Drug use: No   Sexual activity: Not on file  Other Topics Concern   Not on file  Social History Narrative   Not on file   Social Determinants of Health   Financial Resource Strain: Not on file  Food Insecurity: Not on file  Transportation Needs: Not on file  Physical Activity: Not on file  Stress: Not on file  Social Connections: Not on file  Intimate Partner Violence: Not on file      Review of Systems  Constitutional:  Negative for chills, fatigue and  unexpected weight change.  HENT:  Negative for congestion, rhinorrhea, sneezing and sore throat.   Eyes:  Negative for redness.  Respiratory:  Negative for cough, chest tightness and shortness of breath.   Cardiovascular:  Negative for chest pain and palpitations.  Gastrointestinal:  Negative for abdominal pain, constipation, diarrhea, nausea and vomiting.  Genitourinary:  Negative for dysuria and frequency.  Musculoskeletal:  Negative for arthralgias, back pain, joint swelling and neck pain.  Skin:  Negative for rash.  Neurological: Negative.  Negative for tremors and numbness.  Hematological:  Negative for adenopathy. Does not bruise/bleed easily.  Psychiatric/Behavioral:  Negative for behavioral problems (Depression), sleep disturbance and suicidal ideas. The patient is not nervous/anxious.    Vital Signs: BP 124/88   Pulse 80   Temp 98.3 F (36.8 C)   Resp 16   Ht 5' 11.5" (1.816 m)   Wt (!) 314 lb 9.6 oz (142.7 kg)   SpO2 97%   BMI 43.27 kg/m    Physical Exam Vitals reviewed.  Constitutional:      General: He is not in acute distress.    Appearance: Normal appearance. He is obese. He is not ill-appearing.  HENT:     Head: Normocephalic and atraumatic.  Eyes:     Extraocular Movements: Extraocular movements intact.     Pupils: Pupils are equal, round, and reactive to light.  Cardiovascular:     Rate and Rhythm: Normal rate and regular rhythm.  Pulmonary:     Effort: Pulmonary effort is normal. No respiratory distress.  Neurological:     Mental Status: He is alert and oriented to person, place, and time.     Cranial Nerves: No cranial nerve deficit.     Coordination: Coordination normal.     Gait: Gait normal.  Psychiatric:        Mood and Affect: Mood normal.        Behavior: Behavior normal.       Assessment/Plan: 1. Type 2 diabetes mellitus with hyperglycemia, with [place]-term current use of insulin ([place]) Continue farxiga and ozempic as prescribed, follow up  in 2 months to recheck A1C  2. Essential hypertension Stable, continue lisinopril and diltiazem as prescribed.   3. Morbid obesity with BMI of 40.0-44.9, adult (HCC) Lost 5 lbs since he has been on farxiga, continue diet and lifestyle modifications as previously discussed and continue medications as prescribed.    General Counseling: [place] verbalizes understanding of the findings of todays visit and agrees with plan of treatment. I have discussed any further diagnostic evaluation that may be needed or ordered today. We also reviewed his medications today. he has been encouraged to call the office with any questions or concerns that should arise related to todays visit.  No orders of the defined types were placed in this encounter.   No orders of the defined types were placed in this encounter.   Return in about 2 months (around 05/10/2021) for F/U, Recheck A1C, .   Total time spent:20 Minutes Time spent includes review of chart, medications, test results, and follow up plan with the patient.   [place] Controlled Substance Database was reviewed by me.  This patient was seen by Jonetta Osgood, FNP-C in collaboration with Dr. Clayborn Bigness as a part of collaborative care agreement.   Zahlia Deshazer R. Valetta Fuller, MSN, FNP-C Internal medicine

## 2021-03-21 ENCOUNTER — Other Ambulatory Visit: Payer: Self-pay

## 2021-03-21 MED ORDER — INSULIN GLARGINE-YFGN 100 UNIT/ML ~~LOC~~ SOPN: 60.0000 [IU] | PEN_INJECTOR | Freq: Every day | SUBCUTANEOUS | 5 refills | Status: DC

## 2021-03-22 DIAGNOSIS — H52223 Regular astigmatism, bilateral: Secondary | ICD-10-CM | POA: Diagnosis not present

## 2021-03-22 DIAGNOSIS — E133293 Other specified diabetes mellitus with mild nonproliferative diabetic retinopathy without macular edema, bilateral: Secondary | ICD-10-CM | POA: Diagnosis not present

## 2021-03-22 DIAGNOSIS — H2513 Age-related nuclear cataract, bilateral: Secondary | ICD-10-CM | POA: Diagnosis not present

## 2021-03-22 DIAGNOSIS — H5213 Myopia, bilateral: Secondary | ICD-10-CM | POA: Diagnosis not present

## 2021-04-24 ENCOUNTER — Telehealth: Payer: Self-pay

## 2021-04-24 ENCOUNTER — Other Ambulatory Visit: Payer: Self-pay

## 2021-04-24 DIAGNOSIS — Z794 Long term (current) use of insulin: Secondary | ICD-10-CM

## 2021-04-24 DIAGNOSIS — E1165 Type 2 diabetes mellitus with hyperglycemia: Secondary | ICD-10-CM

## 2021-04-24 MED ORDER — OZEMPIC (1 MG/DOSE) 2 MG/1.5ML ~~LOC~~ SOPN: 1.0000 mg | PEN_INJECTOR | SUBCUTANEOUS | 2 refills | Status: DC

## 2021-04-24 NOTE — Telephone Encounter (Signed)
PA sent 04/24/21 and was approved 04/24/21 to 04/23/22, new rx sent to pharmacy

## 2021-04-24 NOTE — Telephone Encounter (Signed)
PA for Humalog Claiborne Rigg was sent 04/24/21 @ 11:28pm

## 2021-04-28 ENCOUNTER — Other Ambulatory Visit: Payer: Self-pay

## 2021-04-28 ENCOUNTER — Ambulatory Visit (INDEPENDENT_AMBULATORY_CARE_PROVIDER_SITE_OTHER): Payer: BC Managed Care – PPO | Admitting: Nurse Practitioner

## 2021-04-28 ENCOUNTER — Encounter: Payer: Self-pay | Admitting: Nurse Practitioner

## 2021-04-28 VITALS — BP 113/68 | HR 85 | Temp 98.1°F | Resp 16 | Ht 71.5 in | Wt 305.8 lb

## 2021-04-28 DIAGNOSIS — Z23 Encounter for immunization: Secondary | ICD-10-CM

## 2021-04-28 DIAGNOSIS — Z0001 Encounter for general adult medical examination with abnormal findings: Secondary | ICD-10-CM | POA: Diagnosis not present

## 2021-04-28 DIAGNOSIS — E1165 Type 2 diabetes mellitus with hyperglycemia: Secondary | ICD-10-CM

## 2021-04-28 DIAGNOSIS — I10 Essential (primary) hypertension: Secondary | ICD-10-CM

## 2021-04-28 DIAGNOSIS — Z125 Encounter for screening for malignant neoplasm of prostate: Secondary | ICD-10-CM

## 2021-04-28 DIAGNOSIS — E782 Mixed hyperlipidemia: Secondary | ICD-10-CM

## 2021-04-28 DIAGNOSIS — E559 Vitamin D deficiency, unspecified: Secondary | ICD-10-CM

## 2021-04-28 DIAGNOSIS — Z6841 Body Mass Index (BMI) 40.0 and over, adult: Secondary | ICD-10-CM

## 2021-04-28 DIAGNOSIS — Z794 Long term (current) use of insulin: Secondary | ICD-10-CM

## 2021-04-28 DIAGNOSIS — R300 Dysuria: Secondary | ICD-10-CM | POA: Diagnosis not present

## 2021-04-28 LAB — POCT GLYCOSYLATED HEMOGLOBIN (HGB A1C): Hemoglobin A1C: 7.9 % — AB (ref 4.0–5.6)

## 2021-04-28 MED ORDER — PNEUMOCOCCAL 20-VAL CONJ VACC 0.5 ML IM SUSY: 0.5000 mL | PREFILLED_SYRINGE | Freq: Once | INTRAMUSCULAR | 0 refills | Status: AC

## 2021-04-28 MED ORDER — ZOSTER VAC RECOMB ADJUVANTED 50 MCG/0.5ML IM SUSR: 0.5000 mL | Freq: Once | INTRAMUSCULAR | 0 refills | Status: AC

## 2021-04-28 MED ORDER — TETANUS-DIPHTH-ACELL PERTUSSIS 5-2.5-18.5 LF-MCG/0.5 IM SUSP: 0.5000 mL | Freq: Once | INTRAMUSCULAR | 0 refills | Status: DC

## 2021-04-28 NOTE — Progress Notes (Signed)
[facility] [place], [place] [address]  Internal MEDICINE  Office Visit Note  Patient Name: Brett Wiley  [id number]  [id number]  Date of Service: 04/28/2021  Chief Complaint  Patient presents with   Annual Exam    Cramps in left arm and calves, left arm feels a little sore    Diabetes   Hyperlipidemia   Hypertension    HPI Brett Wiley presents for an annual well visit and physical exam.  He is a well-appearing 65 year old male.  He has lost 9 more pounds since his previous office visit. A1C is 7.9 today and was previously 8.1 in September.  He reports having cramps in left arm and calves, left arm feels a little sore. Manageable, will watch for now.  Blood pressure is well controlled with current medications.  He had his diabetic eye exam in march this year. He is due for his diabetic foot exam. His colonoscopy was done in 2019 and is due every 5 years. He has not had routine labs in several years and he has no record of checking his PSA level.      Current Medication: Outpatient Encounter Medications as of 04/28/2021  Medication Sig Note   dapagliflozin propanediol (FARXIGA) 10 MG TABS tablet Take 1 tablet (10 mg total) by mouth daily before breakfast.    diltiazem (CARDIZEM CD) 240 MG 24 hr capsule TAKE ONE CAPSULE BY MOUTH EVERY MORNING    HUMALOG KWIKPEN 100 UNIT/ML KwikPen 2-4 Units Sub 3 times daily PRN, max unit 14 units daily  only if blood sugar is above 100, with meal per sliding scale    insulin glargine-yfgn (SEMGLEE) 100 UNIT/ML Pen Inject 60 Units into the skin daily.    Insulin Pen Needle (PEN NEEDLES) 30G X 5 MM MISC 1 each by Does not apply route 4 (four) times daily as needed. E11.65    lisinopril (ZESTRIL) 2.5 MG tablet TAKE ONE TABLET BY MOUTH DAILY    lovastatin (MEVACOR) 20 MG tablet Take 1 tablet (20 mg total) by mouth at bedtime.    mupirocin ointment (BACTROBAN) 2 % Apply small amount to effected area twice daily for one week.     OneTouch Delica Lancets 99I MISC Use as directed 4 times a daily Dx E11.65    pneumococcal 20-valent conjugate vaccine (PREVNAR 20) 0.5 ML injection Inject 0.5 mLs into the muscle once for 1 dose.    Semaglutide, 1 MG/DOSE, (OZEMPIC, 1 MG/DOSE,) 2 MG/1.5ML SOPN Inject 1 mg into the skin once a week.    sildenafil (REVATIO) 20 MG tablet Take 1 tablet (20 mg total) by mouth daily as needed.    [DISCONTINUED] pneumococcal 20-valent conjugate vaccine (PREVNAR 20) 0.5 ML injection Inject 0.5 mLs into the muscle tomorrow at 10 am.    [DISCONTINUED] Zoster Vaccine Adjuvanted [facility]) injection Inject 0.5 mLs into the muscle once.    Tdap (BOOSTRIX) 5-2.5-18.5 LF-MCG/0.5 injection Inject 0.5 mLs into the muscle once for 1 dose.    Zoster Vaccine Adjuvanted [facility]) injection Inject 0.5 mLs into the muscle once for 1 dose.    [DISCONTINUED] insulin glargine (LANTUS) 100 UNIT/ML Solostar Pen Inject 60 Units into the skin daily. 03/21/2021: insurance will only pay for this brand   [DISCONTINUED] Semaglutide, 1 MG/DOSE, (OZEMPIC, 1 MG/DOSE,) 2 MG/1.5ML SOPN Inject 1 mg into the skin once a week.    [DISCONTINUED] Tdap (BOOSTRIX) 5-2.5-18.5 LF-MCG/0.5 injection Inject 0.5 mLs into the muscle once for 1 dose.    No facility-administered encounter  medications on file as of 04/28/2021.    Surgical History: Past Surgical History:  Procedure Laterality Date   COLONOSCOPY WITH PROPOFOL N/A 12/18/2017   Procedure: COLONOSCOPY WITH PROPOFOL;  Surgeon: Jonathon Bellows, MD;  Location: [facility] ENDOSCOPY;  Service: Gastroenterology;  Laterality: N/A;   RECTAL EXAM UNDER ANESTHESIA N/A 05/24/2018   Procedure: RECTAL EXAM UNDER ANESTHESIA WITH POSSIBLE POLYPECTOMY;  Surgeon: Vickie Epley, MD;  Location: [facility] ORS;  Service: General;  Laterality: N/A;    Medical History: Past Medical History:  Diagnosis Date   Arthritis    Atrial fibrillation ([place]) 1993   Diabetes mellitus without complication ([place])    History  of kidney stones    Hypercholesteremia    Hypertension     Family History: Family History  Problem Relation Age of Onset   Diabetes Mother    Diabetes Father    Hypertension Father     Social History   Socioeconomic History   Marital status: Single    Spouse name: Not on file   Number of children: Not on file   Years of education: Not on file   Highest education level: Not on file  Occupational History   Not on file  Tobacco Use   Smoking status: Never   Smokeless tobacco: Never  Vaping Use   Vaping Use: Never used  Substance and Sexual Activity   Alcohol use: No    Alcohol/week: 0.0 standard drinks   Drug use: No   Sexual activity: Not on file  Other Topics Concern   Not on file  Social History Narrative   Not on file   Social Determinants of Health   Financial Resource Strain: Not on file  Food Insecurity: Not on file  Transportation Needs: Not on file  Physical Activity: Not on file  Stress: Not on file  Social Connections: Not on file  Intimate Partner Violence: Not on file      Review of Systems  Constitutional:  Negative for activity change, appetite change, chills, fatigue, fever and unexpected weight change.  HENT: Negative.  Negative for congestion, ear pain, rhinorrhea, sore throat and trouble swallowing.   Eyes: Negative.   Respiratory: Negative.  Negative for cough, chest tightness, shortness of breath and wheezing.   Cardiovascular: Negative.  Negative for chest pain.  Gastrointestinal: Negative.  Negative for abdominal pain, blood in stool, constipation, diarrhea, nausea and vomiting.  Endocrine: Negative.   Genitourinary: Negative.  Negative for difficulty urinating, dysuria, frequency, hematuria and urgency.  Musculoskeletal: Negative.  Negative for arthralgias, back pain, joint swelling, myalgias and neck pain.  Skin: Negative.  Negative for rash and wound.  Allergic/Immunologic: Negative.  Negative for immunocompromised state.   Neurological: Negative.  Negative for dizziness, seizures, numbness and headaches.  Hematological: Negative.   Psychiatric/Behavioral: Negative.  Negative for behavioral problems, self-injury and suicidal ideas. The patient is not nervous/anxious.    Vital Signs: BP 113/68   Pulse 85   Temp 98.1 F (36.7 C)   Resp 16   Ht 5' 11.5" (1.816 m)   Wt (!) 305 lb 12.8 oz (138.7 kg)   SpO2 97%   BMI 42.06 kg/m    Physical Exam Vitals reviewed.  Constitutional:      General: He is awake. He is not in acute distress.    Appearance: Normal appearance. He is well-developed and well-groomed. He is morbidly obese. He is not ill-appearing or diaphoretic.  HENT:     Head: Normocephalic and atraumatic.     Right Ear:  Tympanic membrane, ear canal and external ear normal.     Left Ear: Tympanic membrane, ear canal and external ear normal.     Nose: Nose normal. No congestion or rhinorrhea.     Mouth/Throat:     Lips: Pink.     Mouth: Mucous membranes are moist.     Pharynx: Oropharynx is clear. Uvula midline. No oropharyngeal exudate or posterior oropharyngeal erythema.  Eyes:     General: Lids are normal. Vision grossly intact. Gaze aligned appropriately. No scleral icterus.       Right eye: No discharge.        Left eye: No discharge.     Extraocular Movements: Extraocular movements intact.     Conjunctiva/sclera: Conjunctivae normal.     Pupils: Pupils are equal, round, and reactive to light.     Funduscopic exam:    Right eye: Red reflex present.        Left eye: Red reflex present. Neck:     Thyroid: No thyromegaly.     Vascular: No JVD.     Trachea: Trachea and phonation normal. No tracheal deviation.  Cardiovascular:     Rate and Rhythm: Normal rate and regular rhythm.     Pulses: Normal pulses.          Dorsalis pedis pulses are 2+ on the right side and 2+ on the left side.       Posterior tibial pulses are 2+ on the right side and 2+ on the left side.     Heart sounds:  Normal heart sounds, S1 normal and S2 normal. No murmur heard.   No friction rub. No gallop.  Pulmonary:     Effort: Pulmonary effort is normal. No accessory muscle usage or respiratory distress.     Breath sounds: Normal breath sounds and air entry. No stridor. No wheezing or rales.  Chest:     Chest wall: No tenderness.  Abdominal:     General: Bowel sounds are normal. There is no distension.     Palpations: Abdomen is soft. There is no shifting dullness, fluid wave, mass or pulsatile mass.     Tenderness: There is no abdominal tenderness. There is no guarding or rebound.  Musculoskeletal:        General: No tenderness or deformity. Normal range of motion.     Cervical back: Normal range of motion and neck supple.     Right foot: Normal range of motion. No deformity, bunion, Charcot foot, foot drop or prominent metatarsal heads.     Left foot: Normal range of motion. No deformity, bunion, Charcot foot, foot drop or prominent metatarsal heads.  Feet:     Right foot:     Protective Sensation: 6 sites tested.  6 sites sensed.     Skin integrity: Callus and dry skin present. No ulcer, blister, skin breakdown, erythema, warmth or fissure.     Toenail Condition: Right toenails are abnormally thick.     Left foot:     Protective Sensation: 6 sites tested.  6 sites sensed.     Skin integrity: Callus and dry skin present. No ulcer, blister, skin breakdown, erythema, warmth or fissure.     Toenail Condition: Left toenails are abnormally thick.  Lymphadenopathy:     Cervical: No cervical adenopathy.  Skin:    General: Skin is warm and dry.     Capillary Refill: Capillary refill takes less than 2 seconds.     Coloration: Skin is not pale.  Findings: No erythema or rash.  Neurological:     Mental Status: He is alert and oriented to person, place, and time.     Cranial Nerves: No cranial nerve deficit.     Motor: No abnormal muscle tone.     Coordination: Coordination normal.     Gait:  Gait normal.     Deep Tendon Reflexes: Reflexes are normal and symmetric.  Psychiatric:        Mood and Affect: Mood normal.        Behavior: Behavior normal. Behavior is cooperative.        Thought Content: Thought content normal.        Judgment: Judgment normal.       Assessment/Plan: 1. Encounter for general adult medical examination with abnormal findings Age-appropriate preventive screenings and vaccinations discussed, annual physical exam completed. Routine labs for health maintenance ordered, see below. PHM updated.   2. Type 2 diabetes mellitus with hyperglycemia, with Barbar-term current use of insulin (HCC) A1C continues to improve, routine labs ordered, continue current medications as prescribed. Repeat A1C in 3 months.  - POCT HgB A1C - CBC with Differential/Platelet - CMP14+EGFR - Lipid Profile - TSH + free T4  3. Essential hypertension Stable BP, taking diltiazem and lisinopril. Routine labs ordered.  - CBC with Differential/Platelet - CMP14+EGFR - Lipid Profile - TSH + free T4  4. Mixed hyperlipidemia Routine labs ordered. Currently taking lovastatin.  - CBC with Differential/Platelet - CMP14+EGFR - Lipid Profile - TSH + free T4  5. Vitamin D deficiency Routine lab ordered - Vitamin D (25 hydroxy)  6. Dysuria Routine urinalysis done - UA/M w/rflx Culture, Routine  7. Morbid obesity with BMI of 40.0-44.9, adult ([place]) Has lost 9 more lbs, and is on diabetic medication that will aid in weight loss. As he continues to improve his A1C, his weight should continue to decrease. He acknowledges necessary diet and lifestyle modifications which he is currently working on to improve his diabetes and weight.   8. Encounter for prostate cancer screening Total PSA level ordered - PSA Total (Reflex To Free)  9. Need for vaccination - Tdap ([place]) 5-2.5-18.5 LF-MCG/0.5 injection; Inject 0.5 mLs into the muscle once for 1 dose.  Dispense: 0.5 mL; Refill: 0 -  pneumococcal 20-valent conjugate vaccine (PREVNAR 20) 0.5 ML injection; Inject 0.5 mLs into the muscle once for 1 dose.  Dispense: 0.5 mL; Refill: 0 - Zoster Vaccine Adjuvanted [facility]) injection; Inject 0.5 mLs into the muscle once for 1 dose.  Dispense: 0.5 mL; Refill: 0    General Counseling: Chevelle verbalizes understanding of the findings of todays visit and agrees with plan of treatment. I have discussed any further diagnostic evaluation that may be needed or ordered today. We also reviewed his medications today. he has been encouraged to call the office with any questions or concerns that should arise related to todays visit.    Orders Placed This Encounter  Procedures   UA/M w/rflx Culture, Routine   CBC with Differential/Platelet   CMP14+EGFR   Lipid Profile   TSH + free T4   Vitamin D (25 hydroxy)   POCT HgB A1C    Meds ordered this encounter  Medications   Zoster Vaccine Adjuvanted [facility]) injection    Sig: Inject 0.5 mLs into the muscle once for 1 dose.    Dispense:  0.5 mL    Refill:  0   Tdap (BOOSTRIX) 5-2.5-18.5 LF-MCG/0.5 injection    Sig: Inject 0.5 mLs into the muscle  once for 1 dose.    Dispense:  0.5 mL    Refill:  0   pneumococcal 20-valent conjugate vaccine (PREVNAR 20) 0.5 ML injection    Sig: Inject 0.5 mLs into the muscle once for 1 dose.    Dispense:  0.5 mL    Refill:  0    Return in about 3 months (around 07/29/2021) for F/U, Recheck A1C, Stuart Guillen PCP.   Total time spent:30 Minutes Time spent includes review of chart, medications, test results, and follow up plan with the patient.   [place] Controlled Substance Database was reviewed by me.  This patient was seen by Jonetta Osgood, FNP-C in collaboration with Dr. Clayborn Bigness as a part of collaborative care agreement.  Argelia Formisano R. Valetta Fuller, MSN, FNP-C Internal medicine

## 2021-04-29 LAB — UA/M W/RFLX CULTURE, ROUTINE
Bilirubin, UA: NEGATIVE
Ketones, UA: NEGATIVE
Leukocytes,UA: NEGATIVE
Nitrite, UA: NEGATIVE
Protein,UA: NEGATIVE
RBC, UA: NEGATIVE
Specific Gravity, UA: >=1.03 — AB (ref 1.005–1.030)
Urobilinogen, Ur: 0.2 mg/dL (ref 0.2–1.0)
pH, UA: 5.5 (ref 5.0–7.5)

## 2021-04-29 LAB — MICROSCOPIC EXAMINATION
Bacteria, UA: NONE SEEN
Casts: NONE SEEN /lpf
Epithelial Cells (non renal): NONE SEEN /hpf (ref 0–10)
RBC, Urine: NONE SEEN /hpf (ref 0–2)
WBC, UA: NONE SEEN /hpf (ref 0–5)

## 2021-05-29 ENCOUNTER — Encounter: Payer: Self-pay | Admitting: Nurse Practitioner

## 2021-07-20 ENCOUNTER — Other Ambulatory Visit: Payer: Self-pay | Admitting: Internal Medicine

## 2021-07-20 DIAGNOSIS — E782 Mixed hyperlipidemia: Secondary | ICD-10-CM

## 2021-07-28 ENCOUNTER — Ambulatory Visit (INDEPENDENT_AMBULATORY_CARE_PROVIDER_SITE_OTHER): Payer: BC Managed Care – PPO | Admitting: Nurse Practitioner

## 2021-07-28 ENCOUNTER — Encounter: Payer: Self-pay | Admitting: Nurse Practitioner

## 2021-07-28 ENCOUNTER — Other Ambulatory Visit: Payer: Self-pay

## 2021-07-28 VITALS — BP 130/80 | HR 82 | Temp 98.0°F | Resp 16 | Ht 71.5 in | Wt 310.0 lb

## 2021-07-28 DIAGNOSIS — E782 Mixed hyperlipidemia: Secondary | ICD-10-CM

## 2021-07-28 DIAGNOSIS — I10 Essential (primary) hypertension: Secondary | ICD-10-CM | POA: Diagnosis not present

## 2021-07-28 DIAGNOSIS — E1165 Type 2 diabetes mellitus with hyperglycemia: Secondary | ICD-10-CM | POA: Diagnosis not present

## 2021-07-28 DIAGNOSIS — Z794 Long term (current) use of insulin: Secondary | ICD-10-CM

## 2021-07-28 LAB — POCT GLYCOSYLATED HEMOGLOBIN (HGB A1C): Hemoglobin A1C: 7 % — AB (ref 4.0–5.6)

## 2021-07-28 MED ORDER — DAPAGLIFLOZIN PROPANEDIOL 10 MG PO TABS: 10.0000 mg | ORAL_TABLET | Freq: Every day | ORAL | 3 refills | Status: DC

## 2021-07-28 MED ORDER — LOVASTATIN 20 MG PO TABS: 20.0000 mg | ORAL_TABLET | Freq: Every day | ORAL | 3 refills | Status: DC

## 2021-07-28 MED ORDER — HUMALOG KWIKPEN 100 UNIT/ML ~~LOC~~ SOPN: PEN_INJECTOR | SUBCUTANEOUS | 0 refills | Status: DC

## 2021-07-28 MED ORDER — INSULIN GLARGINE-YFGN 100 UNIT/ML ~~LOC~~ SOPN: 50.0000 [IU] | PEN_INJECTOR | Freq: Every day | SUBCUTANEOUS | 1 refills | Status: DC

## 2021-07-28 MED ORDER — LISINOPRIL 2.5 MG PO TABS: 2.5000 mg | ORAL_TABLET | Freq: Every day | ORAL | 1 refills | Status: DC

## 2021-07-28 MED ORDER — SEMAGLUTIDE (2 MG/DOSE) 8 MG/3ML ~~LOC~~ SOPN: 2.0000 mg | PEN_INJECTOR | SUBCUTANEOUS | 1 refills | Status: DC

## 2021-07-28 MED ORDER — DILTIAZEM HCL ER COATED BEADS 240 MG PO CP24: 240.0000 mg | ORAL_CAPSULE | Freq: Every morning | ORAL | 3 refills | Status: DC

## 2021-07-28 NOTE — Progress Notes (Unsigned)
[facility] [place], [place] [address]  Internal MEDICINE  Office Visit Note  Patient Name: Brett Wiley  [id number]  [id number]  Date of Service: 07/28/2021  Chief Complaint  Patient presents with   Follow-up   Hypertension   Hyperlipidemia   Diabetes    HPI Brett Wiley presents for a follow-up visit for  A1c 7.0   130/80  Current Medication: Outpatient Encounter Medications as of 07/28/2021  Medication Sig   Insulin Pen Needle (PEN NEEDLES) 30G X 5 MM MISC 1 each by Does not apply route 4 (four) times daily as needed. E11.65   mupirocin ointment (BACTROBAN) 2 % Apply small amount to effected area twice daily for one week.   OneTouch Delica Lancets 20U MISC Use as directed 4 times a daily Dx E11.65   Semaglutide, 2 MG/DOSE, 8 MG/3ML SOPN Inject 2 mg into the skin once a week.   sildenafil (REVATIO) 20 MG tablet Take 1 tablet (20 mg total) by mouth daily as needed.   [DISCONTINUED] dapagliflozin propanediol (FARXIGA) 10 MG TABS tablet Take 1 tablet (10 mg total) by mouth daily before breakfast.   [DISCONTINUED] diltiazem (CARDIZEM CD) 240 MG 24 hr capsule TAKE ONE CAPSULE BY MOUTH EVERY MORNING   [DISCONTINUED] HUMALOG KWIKPEN 100 UNIT/ML KwikPen 2-4 Units Sub 3 times daily PRN, max unit 14 units daily  only if blood sugar is above 100, with meal per sliding scale   [DISCONTINUED] insulin glargine-yfgn (SEMGLEE) 100 UNIT/ML Pen Inject 60 Units into the skin daily.   [DISCONTINUED] lisinopril (ZESTRIL) 2.5 MG tablet TAKE ONE TABLET BY MOUTH DAILY   [DISCONTINUED] lovastatin (MEVACOR) 20 MG tablet TAKE ONE TABLET BY MOUTH EVERY NIGHT AT BEDTIME   [DISCONTINUED] Semaglutide, 1 MG/DOSE, (OZEMPIC, 1 MG/DOSE,) 2 MG/1.5ML SOPN Inject 1 mg into the skin once a week.   dapagliflozin propanediol (FARXIGA) 10 MG TABS tablet Take 1 tablet (10 mg total) by mouth daily before breakfast.   diltiazem (CARDIZEM CD) 240 MG 24 hr capsule Take 1 capsule (240 mg total) by  mouth every morning.   HUMALOG KWIKPEN 100 UNIT/ML KwikPen 2-4 Units Sub 3 times daily PRN, max unit 14 units daily  only if blood sugar is above 100, with meal per sliding scale   insulin glargine-yfgn (SEMGLEE) 100 UNIT/ML Pen Inject 50 Units into the skin daily.   lisinopril (ZESTRIL) 2.5 MG tablet Take 1 tablet (2.5 mg total) by mouth daily.   lovastatin (MEVACOR) 20 MG tablet Take 1 tablet (20 mg total) by mouth at bedtime.   Tdap (BOOSTRIX) 5-2.5-18.5 LF-MCG/0.5 injection Inject 0.5 mLs into the muscle once for 1 dose.   No facility-administered encounter medications on file as of 07/28/2021.    Surgical History: Past Surgical History:  Procedure Laterality Date   COLONOSCOPY WITH PROPOFOL N/A 12/18/2017   Procedure: COLONOSCOPY WITH PROPOFOL;  Surgeon: Jonathon Bellows, MD;  Location: [facility] ENDOSCOPY;  Service: Gastroenterology;  Laterality: N/A;   RECTAL EXAM UNDER ANESTHESIA N/A 05/24/2018   Procedure: RECTAL EXAM UNDER ANESTHESIA WITH POSSIBLE POLYPECTOMY;  Surgeon: Vickie Epley, MD;  Location: [facility] ORS;  Service: General;  Laterality: N/A;    Medical History: Past Medical History:  Diagnosis Date   Arthritis    Atrial fibrillation ([place]) 1993   Diabetes mellitus without complication ([place])    History of kidney stones    Hypercholesteremia    Hypertension     Family History: Family History  Problem Relation Age of Onset   Diabetes Mother  Diabetes Father    Hypertension Father     Social History   Socioeconomic History   Marital status: Single    Spouse name: Not on file   Number of children: Not on file   Years of education: Not on file   Highest education level: Not on file  Occupational History   Not on file  Tobacco Use   Smoking status: Never   Smokeless tobacco: Never  Vaping Use   Vaping Use: Never used  Substance and Sexual Activity   Alcohol use: No    Alcohol/week: 0.0 standard drinks   Drug use: No   Sexual activity: Not on file  Other Topics  Concern   Not on file  Social History Narrative   Not on file   Social Determinants of Health   Financial Resource Strain: Not on file  Food Insecurity: Not on file  Transportation Needs: Not on file  Physical Activity: Not on file  Stress: Not on file  Social Connections: Not on file  Intimate Partner Violence: Not on file      Review of Systems  Vital Signs: BP (!) 134/94    Pulse 82    Temp 98 F (36.7 C)    Resp 16    Ht 5' 11.5" (1.816 m)    Wt (!) 310 lb (140.6 kg)    SpO2 97%    BMI 42.63 kg/m    Physical Exam     Assessment/Plan:   General Counseling: Brett Wiley verbalizes understanding of the findings of todays visit and agrees with plan of treatment. I have discussed any further diagnostic evaluation that may be needed or ordered today. We also reviewed his medications today. he has been encouraged to call the office with any questions or concerns that should arise related to todays visit.    Orders Placed This Encounter  Procedures   POCT HgB A1C    Meds ordered this encounter  Medications   Semaglutide, 2 MG/DOSE, 8 MG/3ML SOPN    Sig: Inject 2 mg into the skin once a week.    Dispense:  9 mL    Refill:  1    Note increased dose, discontinue ozempic 1 mg dose.   lisinopril (ZESTRIL) 2.5 MG tablet    Sig: Take 1 tablet (2.5 mg total) by mouth daily.    Dispense:  90 tablet    Refill:  1   lovastatin (MEVACOR) 20 MG tablet    Sig: Take 1 tablet (20 mg total) by mouth at bedtime.    Dispense:  90 tablet    Refill:  3   dapagliflozin propanediol (FARXIGA) 10 MG TABS tablet    Sig: Take 1 tablet (10 mg total) by mouth daily before breakfast.    Dispense:  90 tablet    Refill:  3    PA approved valid from 02/16/21 to 02/16/22   diltiazem (CARDIZEM CD) 240 MG 24 hr capsule    Sig: Take 1 capsule (240 mg total) by mouth every morning.    Dispense:  90 capsule    Refill:  3   insulin glargine-yfgn (SEMGLEE) 100 UNIT/ML Pen    Sig: Inject 50 Units into  the skin daily.    Dispense:  45 mL    Refill:  1   HUMALOG KWIKPEN 100 UNIT/ML KwikPen    Sig: 2-4 Units Sub 3 times daily PRN, max unit 14 units daily  only if blood sugar is above 100, with meal per sliding scale  Dispense:  15 mL    Refill:  0    Please fill as novolog if this is what patient's insurance prefers. Thanks    Return in about 3 months (around 10/25/2021) for F/U, Recheck A1C, Rhyder Koegel PCP.   Total time spent:*** Minutes Time spent includes review of chart, medications, test results, and follow up plan with the patient.   [place] Controlled Substance Database was reviewed by me.  This patient was seen by Jonetta Osgood, FNP-C in collaboration with Dr. Clayborn Bigness as a part of collaborative care agreement.   Kensi Karr R. Valetta Fuller, MSN, FNP-C Internal medicine

## 2021-09-04 ENCOUNTER — Encounter: Payer: Self-pay | Admitting: Nurse Practitioner

## 2021-10-24 DIAGNOSIS — H52223 Regular astigmatism, bilateral: Secondary | ICD-10-CM | POA: Diagnosis not present

## 2021-10-24 DIAGNOSIS — H2513 Age-related nuclear cataract, bilateral: Secondary | ICD-10-CM | POA: Diagnosis not present

## 2021-10-24 DIAGNOSIS — E133293 Other specified diabetes mellitus with mild nonproliferative diabetic retinopathy without macular edema, bilateral: Secondary | ICD-10-CM | POA: Diagnosis not present

## 2021-10-24 DIAGNOSIS — H5213 Myopia, bilateral: Secondary | ICD-10-CM | POA: Diagnosis not present

## 2021-10-27 ENCOUNTER — Encounter: Payer: Self-pay | Admitting: Nurse Practitioner

## 2021-10-27 ENCOUNTER — Ambulatory Visit: Payer: BC Managed Care – PPO | Admitting: Nurse Practitioner

## 2021-10-27 VITALS — BP 102/59 | HR 80 | Temp 98.2°F | Resp 16 | Ht 71.5 in | Wt 302.0 lb

## 2021-10-27 DIAGNOSIS — E1165 Type 2 diabetes mellitus with hyperglycemia: Secondary | ICD-10-CM | POA: Diagnosis not present

## 2021-10-27 DIAGNOSIS — Z794 Long term (current) use of insulin: Secondary | ICD-10-CM

## 2021-10-27 LAB — POCT GLYCOSYLATED HEMOGLOBIN (HGB A1C): Hemoglobin A1C: 6.3 % — AB (ref 4.0–5.6)

## 2021-10-27 MED ORDER — HUMALOG KWIKPEN 100 UNIT/ML ~~LOC~~ SOPN
PEN_INJECTOR | SUBCUTANEOUS | 0 refills | Status: DC
Start: 2021-10-27 — End: 2022-12-29

## 2021-10-27 MED ORDER — SEMAGLUTIDE (2 MG/DOSE) 8 MG/3ML ~~LOC~~ SOPN
2.0000 mg | PEN_INJECTOR | SUBCUTANEOUS | 1 refills | Status: DC
Start: 2021-10-27 — End: 2022-08-14

## 2021-10-27 NOTE — Progress Notes (Signed)
[facility] [place], [place] [address]  Internal MEDICINE  Office Visit Note  Patient Name: Brett Wiley  [id number]  [id number]  Date of Service: 10/27/2021  Chief Complaint  Patient presents with   Follow-up   Diabetes   Hyperlipidemia   Hypertension    HPI Brett Wiley presents for follow-up visit for diabetes, hypertension and hyperlipidemia.  His A1c is 6.3 today which is significantly improved from 7.0 in February earlier this year.  His blood pressure is also stable on current medications and he is continuing to take cholesterol-lowering medications as well.  He has lost a total of 17 pounds since September last year.     Current Medication: Outpatient Encounter Medications as of 10/27/2021  Medication Sig   dapagliflozin propanediol (FARXIGA) 10 MG TABS tablet Take 1 tablet (10 mg total) by mouth daily before breakfast.   diltiazem (CARDIZEM CD) 240 MG 24 hr capsule Take 1 capsule (240 mg total) by mouth every morning.   insulin glargine-yfgn (SEMGLEE) 100 UNIT/ML Pen Inject 50 Units into the skin daily.   Insulin Pen Needle (PEN NEEDLES) 30G X 5 MM MISC 1 each by Does not apply route 4 (four) times daily as needed. E11.65   lisinopril (ZESTRIL) 2.5 MG tablet Take 1 tablet (2.5 mg total) by mouth daily.   lovastatin (MEVACOR) 20 MG tablet Take 1 tablet (20 mg total) by mouth at bedtime.   mupirocin ointment (BACTROBAN) 2 % Apply small amount to effected area twice daily for one week.   OneTouch Delica Lancets 01B MISC Use as directed 4 times a daily Dx E11.65   sildenafil (REVATIO) 20 MG tablet Take 1 tablet (20 mg total) by mouth daily as needed.   [DISCONTINUED] HUMALOG KWIKPEN 100 UNIT/ML KwikPen 2-4 Units Sub 3 times daily PRN, max unit 14 units daily  only if blood sugar is above 100, with meal per sliding scale   [DISCONTINUED] Semaglutide, 2 MG/DOSE, 8 MG/3ML SOPN Inject 2 mg into the skin once a week.   HUMALOG KWIKPEN 100 UNIT/ML KwikPen 2-4  Units Sub 3 times daily PRN, max unit 14 units daily  only if blood sugar is above 100, with meal per sliding scale   Semaglutide, 2 MG/DOSE, 8 MG/3ML SOPN Inject 2 mg into the skin once a week.   Tdap (BOOSTRIX) 5-2.5-18.5 LF-MCG/0.5 injection Inject 0.5 mLs into the muscle once for 1 dose.   No facility-administered encounter medications on file as of 10/27/2021.    Surgical History: Past Surgical History:  Procedure Laterality Date   COLONOSCOPY WITH PROPOFOL N/A 12/18/2017   Procedure: COLONOSCOPY WITH PROPOFOL;  Surgeon: Brett Bellows, MD;  Location: [facility] ENDOSCOPY;  Service: Gastroenterology;  Laterality: N/A;   RECTAL EXAM UNDER ANESTHESIA N/A 05/24/2018   Procedure: RECTAL EXAM UNDER ANESTHESIA WITH POSSIBLE POLYPECTOMY;  Surgeon: Brett Epley, MD;  Location: [facility] ORS;  Service: General;  Laterality: N/A;    Medical History: Past Medical History:  Diagnosis Date   Arthritis    Atrial fibrillation ([place]) 1993   Diabetes mellitus without complication ([place])    History of kidney stones    Hypercholesteremia    Hypertension     Family History: Family History  Problem Relation Age of Onset   Diabetes Mother    Diabetes Father    Hypertension Father     Social History   Socioeconomic History   Marital status: Single    Spouse name: Not on file   Number of children: Not on  file   Years of education: Not on file   Highest education level: Not on file  Occupational History   Not on file  Tobacco Use   Smoking status: Never   Smokeless tobacco: Never  Vaping Use   Vaping Use: Never used  Substance and Sexual Activity   Alcohol use: No    Alcohol/week: 0.0 standard drinks   Drug use: No   Sexual activity: Not on file  Other Topics Concern   Not on file  Social History Narrative   Not on file   Social Determinants of Health   Financial Resource Strain: Not on file  Food Insecurity: Not on file  Transportation Needs: Not on file  Physical Activity: Not on  file  Stress: Not on file  Social Connections: Not on file  Intimate Partner Violence: Not on file      Review of Systems  Constitutional:  Negative for chills, fatigue and unexpected weight change.  HENT:  Negative for congestion, rhinorrhea, sneezing and sore throat.   Eyes:  Negative for redness.  Respiratory:  Negative for cough, chest tightness and shortness of breath.   Cardiovascular:  Negative for chest pain and palpitations.  Gastrointestinal:  Negative for abdominal pain, constipation, diarrhea, nausea and vomiting.  Genitourinary:  Negative for dysuria and frequency.  Musculoskeletal:  Negative for arthralgias, back pain, joint swelling and neck pain.  Skin:  Negative for rash.  Neurological: Negative.  Negative for tremors and numbness.  Hematological:  Negative for adenopathy. Does not bruise/bleed easily.  Psychiatric/Behavioral:  Negative for behavioral problems (Depression), sleep disturbance and suicidal ideas. The patient is not nervous/anxious.     Vital Signs: BP (!) 102/59   Pulse 80   Temp 98.2 F (36.8 C)   Resp 16   Ht 5' 11.5" (1.816 m)   Wt (!) 302 lb (137 kg)   SpO2 98%   BMI 41.53 kg/m    Physical Exam Vitals reviewed.  Constitutional:      General: He is not in acute distress.    Appearance: Normal appearance. He is morbidly obese. He is not ill-appearing.  HENT:     Head: Normocephalic and atraumatic.  Eyes:     Pupils: Pupils are equal, round, and reactive to light.  Cardiovascular:     Rate and Rhythm: Normal rate and regular rhythm.  Pulmonary:     Effort: Pulmonary effort is normal. No respiratory distress.  Neurological:     Mental Status: He is alert and oriented to person, place, and time.  Psychiatric:        Mood and Affect: Mood normal.        Behavior: Behavior normal.        Assessment/Plan: 1. Type 2 diabetes mellitus with hyperglycemia, with [place]-term current use of insulin (HCC) Continue medications as  prescribed, ozempic was increased.  Follow-up to repeat A1c in 3 months - POCT HgB A1C - Semaglutide, 2 MG/DOSE, 8 MG/3ML SOPN; Inject 2 mg into the skin once a week.  Dispense: 9 mL; Refill: 1 - HUMALOG KWIKPEN 100 UNIT/ML KwikPen; 2-4 Units Sub 3 times daily PRN, max unit 14 units daily  only if blood sugar is above 100, with meal per sliding scale  Dispense: 15 mL; Refill: 0   2. Essential hypertension Blood pressure stable on current medications, continue as prescribed, refills ordered. - lisinopril (ZESTRIL) 2.5 MG tablet; Take 1 tablet (2.5 mg total) by mouth daily.  Dispense: 90 tablet; Refill: 1 - diltiazem (CARDIZEM CD)  240 MG 24 hr capsule; Take 1 capsule (240 mg total) by mouth every morning.  Dispense: 90 capsule; Refill: 3   3. Mixed hyperlipidemia Patient is tolerating current dose of medication and is not experiencing any adverse side effects, continue as prescribed, refills ordered. - lovastatin (MEVACOR) 20 MG tablet; Take 1 tablet (20 mg total) by mouth at bedtime.  Dispense: 90 tablet; Refill: 3    General Counseling: Carman verbalizes understanding of the findings of todays visit and agrees with plan of treatment. I have discussed any further diagnostic evaluation that may be needed or ordered today. We also reviewed his medications today. he has been encouraged to call the office with any questions or concerns that should arise related to todays visit.    Orders Placed This Encounter  Procedures   POCT HgB A1C    Meds ordered this encounter  Medications   Semaglutide, 2 MG/DOSE, 8 MG/3ML SOPN    Sig: Inject 2 mg into the skin once a week.    Dispense:  9 mL    Refill:  1    For future refills   HUMALOG KWIKPEN 100 UNIT/ML KwikPen    Sig: 2-4 Units Sub 3 times daily PRN, max unit 14 units daily  only if blood sugar is above 100, with meal per sliding scale    Dispense:  15 mL    Refill:  0    Please fill as novolog if this is what patient's insurance prefers.  Thanks    Return in about 3 months (around 01/27/2022) for F/U, Recheck A1C, Arla Boutwell PCP.   Total time spent:30 Minutes Time spent includes review of chart, medications, test results, and follow up plan with the patient.    Controlled Substance Database was reviewed by me.  This patient was seen by Jonetta Osgood, FNP-C in collaboration with Dr. Clayborn Bigness as a part of collaborative care agreement.   Keyana Guevara R. Valetta Fuller, MSN, FNP-C Internal medicine

## 2021-11-30 DIAGNOSIS — M7989 Other specified soft tissue disorders: Secondary | ICD-10-CM | POA: Diagnosis not present

## 2021-11-30 DIAGNOSIS — I4891 Unspecified atrial fibrillation: Secondary | ICD-10-CM | POA: Diagnosis not present

## 2021-11-30 DIAGNOSIS — I10 Essential (primary) hypertension: Secondary | ICD-10-CM | POA: Diagnosis not present

## 2021-11-30 DIAGNOSIS — S93601A Unspecified sprain of right foot, initial encounter: Secondary | ICD-10-CM | POA: Diagnosis not present

## 2021-11-30 DIAGNOSIS — X509XXA Other and unspecified overexertion or strenuous movements or postures, initial encounter: Secondary | ICD-10-CM | POA: Diagnosis not present

## 2021-11-30 DIAGNOSIS — M2011 Hallux valgus (acquired), right foot: Secondary | ICD-10-CM | POA: Diagnosis not present

## 2021-11-30 DIAGNOSIS — S99921A Unspecified injury of right foot, initial encounter: Secondary | ICD-10-CM | POA: Diagnosis not present

## 2021-11-30 DIAGNOSIS — M79671 Pain in right foot: Secondary | ICD-10-CM | POA: Diagnosis not present

## 2021-11-30 DIAGNOSIS — Z6841 Body Mass Index (BMI) 40.0 and over, adult: Secondary | ICD-10-CM | POA: Diagnosis not present

## 2021-11-30 DIAGNOSIS — M79672 Pain in left foot: Secondary | ICD-10-CM | POA: Diagnosis not present

## 2021-11-30 DIAGNOSIS — E119 Type 2 diabetes mellitus without complications: Secondary | ICD-10-CM | POA: Diagnosis not present

## 2021-12-01 DIAGNOSIS — M7989 Other specified soft tissue disorders: Secondary | ICD-10-CM | POA: Diagnosis not present

## 2021-12-01 DIAGNOSIS — M2011 Hallux valgus (acquired), right foot: Secondary | ICD-10-CM | POA: Diagnosis not present

## 2021-12-19 ENCOUNTER — Encounter: Payer: Self-pay | Admitting: Nurse Practitioner

## 2021-12-25 ENCOUNTER — Other Ambulatory Visit: Payer: Self-pay | Admitting: Nurse Practitioner

## 2022-01-26 ENCOUNTER — Ambulatory Visit (INDEPENDENT_AMBULATORY_CARE_PROVIDER_SITE_OTHER): Payer: BC Managed Care – PPO | Admitting: Nurse Practitioner

## 2022-01-26 VITALS — BP 122/80 | HR 78 | Temp 97.8°F | Resp 16 | Ht 71.5 in | Wt 294.0 lb

## 2022-01-26 DIAGNOSIS — Z794 Long term (current) use of insulin: Secondary | ICD-10-CM | POA: Diagnosis not present

## 2022-01-26 DIAGNOSIS — Z23 Encounter for immunization: Secondary | ICD-10-CM | POA: Diagnosis not present

## 2022-01-26 DIAGNOSIS — Z6841 Body Mass Index (BMI) 40.0 and over, adult: Secondary | ICD-10-CM

## 2022-01-26 DIAGNOSIS — I10 Essential (primary) hypertension: Secondary | ICD-10-CM | POA: Diagnosis not present

## 2022-01-26 DIAGNOSIS — E1165 Type 2 diabetes mellitus with hyperglycemia: Secondary | ICD-10-CM

## 2022-01-26 LAB — POCT GLYCOSYLATED HEMOGLOBIN (HGB A1C): Hemoglobin A1C: 6.1 % — AB (ref 4.0–5.6)

## 2022-01-26 MED ORDER — LISINOPRIL 2.5 MG PO TABS: 2.5000 mg | ORAL_TABLET | Freq: Every day | ORAL | 1 refills | Status: DC

## 2022-01-26 MED ORDER — ZOSTER VAC RECOMB ADJUVANTED 50 MCG/0.5ML IM SUSR: 0.5000 mL | Freq: Once | INTRAMUSCULAR | 0 refills | Status: AC

## 2022-01-26 NOTE — Progress Notes (Signed)
[facility] [place], [place] [address]  Internal MEDICINE  Office Visit Note  Patient Name: Brett Wiley  [id number]  [id number]  Date of Service: 01/26/2022  Chief Complaint  Patient presents with   Follow-up   Diabetes   Hyperlipidemia   Hypertension    HPI Brett Wiley presents for follow-up visit for diabetes, hypertension and hyperlipidemia.  His A1c is 6.1 today which is significantly improved from 6.3 in May this year.  His blood pressure is also stable on current medications and he is continuing to take cholesterol-lowering medications as well.  He has lost a total of 17 pounds since September last year as of his May visit, and he has now lost an additional 8 lbs. Decreased to 40 on basal insulin, is going to drop another 5 since his glucose have been 95-108.    Current Medication: Outpatient Encounter Medications as of 01/26/2022  Medication Sig   dapagliflozin propanediol (FARXIGA) 10 MG TABS tablet Take 1 tablet (10 mg total) by mouth daily before breakfast.   diltiazem (CARDIZEM CD) 240 MG 24 hr capsule Take 1 capsule (240 mg total) by mouth every morning.   HUMALOG KWIKPEN 100 UNIT/ML KwikPen 2-4 Units Sub 3 times daily PRN, max unit 14 units daily  only if blood sugar is above 100, with meal per sliding scale   insulin glargine-yfgn (SEMGLEE) 100 UNIT/ML Pen Inject 50 Units into the skin daily.   Insulin Pen Needle (PEN NEEDLES) 30G X 5 MM MISC 1 each by Does not apply route 4 (four) times daily as needed. E11.65   lovastatin (MEVACOR) 20 MG tablet Take 1 tablet (20 mg total) by mouth at bedtime.   mupirocin ointment (BACTROBAN) 2 % Apply small amount to effected area twice daily for one week.   NOVOLOG FLEXPEN 100 UNIT/ML FlexPen INJECT 2-4 UNITS UNDER THE SKIN THREE TIMES A DAY AS NEEDED WITH MEALS PER SLIDING SCALE. MAX 14 UNITS DAILY ONLY IF BLOOD SUGAR IS ABOVE 100.   OneTouch Delica Lancets 62B MISC Use as directed 4 times a daily Dx E11.65    Semaglutide, 2 MG/DOSE, 8 MG/3ML SOPN Inject 2 mg into the skin once a week.   sildenafil (REVATIO) 20 MG tablet Take 1 tablet (20 mg total) by mouth daily as needed.   [DISCONTINUED] lisinopril (ZESTRIL) 2.5 MG tablet Take 1 tablet (2.5 mg total) by mouth daily.   [DISCONTINUED] Zoster Vaccine Adjuvanted [facility]) injection Inject 0.5 mLs into the muscle once.   lisinopril (ZESTRIL) 2.5 MG tablet Take 1 tablet (2.5 mg total) by mouth daily.   Tdap (BOOSTRIX) 5-2.5-18.5 LF-MCG/0.5 injection Inject 0.5 mLs into the muscle once for 1 dose.   Zoster Vaccine Adjuvanted [facility]) injection Inject 0.5 mLs into the muscle once for 1 dose.   No facility-administered encounter medications on file as of 01/26/2022.    Surgical History: Past Surgical History:  Procedure Laterality Date   COLONOSCOPY WITH PROPOFOL N/A 12/18/2017   Procedure: COLONOSCOPY WITH PROPOFOL;  Surgeon: Jonathon Bellows, MD;  Location: [facility] ENDOSCOPY;  Service: Gastroenterology;  Laterality: N/A;   RECTAL EXAM UNDER ANESTHESIA N/A 05/24/2018   Procedure: RECTAL EXAM UNDER ANESTHESIA WITH POSSIBLE POLYPECTOMY;  Surgeon: Vickie Epley, MD;  Location: [facility] ORS;  Service: General;  Laterality: N/A;    Medical History: Past Medical History:  Diagnosis Date   Arthritis    Atrial fibrillation ([place]) 1993   Diabetes mellitus without complication ([place])    History of kidney stones  Hypercholesteremia    Hypertension     Family History: Family History  Problem Relation Age of Onset   Diabetes Mother    Diabetes Father    Hypertension Father     Social History   Socioeconomic History   Marital status: Single    Spouse name: Not on file   Number of children: Not on file   Years of education: Not on file   Highest education level: Not on file  Occupational History   Not on file  Tobacco Use   Smoking status: Never   Smokeless tobacco: Never  Vaping Use   Vaping Use: Never used  Substance and Sexual Activity    Alcohol use: No    Alcohol/week: 0.0 standard drinks of alcohol   Drug use: No   Sexual activity: Not on file  Other Topics Concern   Not on file  Social History Narrative   Not on file   Social Determinants of Health   Financial Resource Strain: Not on file  Food Insecurity: Not on file  Transportation Needs: Not on file  Physical Activity: Not on file  Stress: Not on file  Social Connections: Not on file  Intimate Partner Violence: Not on file      Review of Systems  Constitutional:  Negative for chills, fatigue and unexpected weight change.  HENT:  Negative for congestion, rhinorrhea, sneezing and sore throat.   Eyes:  Negative for redness.  Respiratory:  Negative for cough, chest tightness and shortness of breath.   Cardiovascular:  Negative for chest pain and palpitations.  Gastrointestinal:  Negative for abdominal pain, constipation, diarrhea, nausea and vomiting.  Genitourinary:  Negative for dysuria and frequency.  Musculoskeletal:  Negative for arthralgias, back pain, joint swelling and neck pain.  Skin:  Negative for rash.  Neurological: Negative.  Negative for tremors and numbness.  Hematological:  Negative for adenopathy. Does not bruise/bleed easily.  Psychiatric/Behavioral:  Negative for behavioral problems (Depression), sleep disturbance and suicidal ideas. The patient is not nervous/anxious.     Vital Signs: BP 122/80   Pulse 78   Temp 97.8 F (36.6 C)   Resp 16   Ht 5' 11.5" (1.816 m)   Wt 294 lb (133.4 kg)   SpO2 97%   BMI 40.43 kg/m    Physical Exam Vitals reviewed.  Constitutional:      General: He is not in acute distress.    Appearance: Normal appearance. He is morbidly obese. He is not ill-appearing.  HENT:     Head: Normocephalic and atraumatic.  Eyes:     Pupils: Pupils are equal, round, and reactive to light.  Cardiovascular:     Rate and Rhythm: Normal rate and regular rhythm.  Pulmonary:     Effort: Pulmonary effort is normal.  No respiratory distress.  Neurological:     Mental Status: He is alert and oriented to person, place, and time.  Psychiatric:        Mood and Affect: Mood normal.        Behavior: Behavior normal.        Assessment/Plan: 1. Type 2 diabetes mellitus with hyperglycemia, with [place]-term current use of insulin (HCC) A1c continues to improve, continue medications as prescribed. Follow up in 3 months to repeat a1c.  - POCT HgB A1C  2. Essential hypertension Stable with current medications, no changes. Continue as prescribed - lisinopril (ZESTRIL) 2.5 MG tablet; Take 1 tablet (2.5 mg total) by mouth daily.  Dispense: 90 tablet; Refill: 1  3. Morbid obesity with BMI of 40.0-44.9, adult (HCC) Lost 17 lbs so far and still losing, continue current diet and lifestyle modifications  4. Need for vaccination - Zoster Vaccine Adjuvanted [facility]) injection; Inject 0.5 mLs into the muscle once for 1 dose.  Dispense: 0.5 mL; Refill: 0   General Counseling: Brett Wiley verbalizes understanding of the findings of todays visit and agrees with plan of treatment. I have discussed any further diagnostic evaluation that may be needed or ordered today. We also reviewed his medications today. he has been encouraged to call the office with any questions or concerns that should arise related to todays visit.    Orders Placed This Encounter  Procedures   POCT HgB A1C    Meds ordered this encounter  Medications   Zoster Vaccine Adjuvanted [facility]) injection    Sig: Inject 0.5 mLs into the muscle once for 1 dose.    Dispense:  0.5 mL    Refill:  0   lisinopril (ZESTRIL) 2.5 MG tablet    Sig: Take 1 tablet (2.5 mg total) by mouth daily.    Dispense:  90 tablet    Refill:  1    Return in about 3 months (around 04/28/2022) for F/U, Recheck A1C, Calder Oblinger PCP.   Total time spent:30 Minutes Time spent includes review of chart, medications, test results, and follow up plan with the patient.   [place]  Controlled Substance Database was reviewed by me.  This patient was seen by Jonetta Osgood, FNP-C in collaboration with Dr. Clayborn Bigness as a part of collaborative care agreement.   Vernecia Umble R. Valetta Fuller, MSN, FNP-C Internal medicine

## 2022-02-27 ENCOUNTER — Other Ambulatory Visit: Payer: Self-pay | Admitting: Nurse Practitioner

## 2022-02-27 DIAGNOSIS — E1165 Type 2 diabetes mellitus with hyperglycemia: Secondary | ICD-10-CM

## 2022-03-04 ENCOUNTER — Encounter: Payer: Self-pay | Admitting: Nurse Practitioner

## 2022-04-06 ENCOUNTER — Telehealth: Payer: Self-pay

## 2022-04-12 ENCOUNTER — Telehealth: Payer: BC Managed Care – PPO | Admitting: Nurse Practitioner

## 2022-04-12 ENCOUNTER — Encounter: Payer: Self-pay | Admitting: Nurse Practitioner

## 2022-04-12 VITALS — Resp 16 | Ht 71.5 in | Wt 284.0 lb

## 2022-04-12 DIAGNOSIS — J028 Acute pharyngitis due to other specified organisms: Secondary | ICD-10-CM | POA: Diagnosis not present

## 2022-04-12 DIAGNOSIS — B9689 Other specified bacterial agents as the cause of diseases classified elsewhere: Secondary | ICD-10-CM | POA: Diagnosis not present

## 2022-04-12 DIAGNOSIS — J029 Acute pharyngitis, unspecified: Secondary | ICD-10-CM

## 2022-04-12 MED ORDER — AMOXICILLIN 500 MG PO CAPS: 500.0000 mg | ORAL_CAPSULE | Freq: Two times a day (BID) | ORAL | 0 refills | Status: DC

## 2022-04-12 NOTE — Progress Notes (Signed)
[facility] [place], [place] [address]  Internal MEDICINE  Telephone Visit  Patient Name: Brett Wiley  [id number]  [id number]  Date of Service: 04/12/2022  I connected with the patient at 1620 by telephone and verified the patients identity using two identifiers.   I discussed the limitations, risks, security and privacy concerns of performing an evaluation and management service by telephone and the availability of in person appointments. I also discussed with the patient that there may be a patient responsible charge related to the service.  The patient expressed understanding and agrees to proceed.    Chief Complaint  Patient presents with   Telephone Screen    Sore throat.  Wants some antibiotic.     Telephone Assessment   Sore Throat    HPI Brett Wiley presents for a telehealth virtual visit for sore throat x1 week.  Tried OTC medications with minimal relief.  Was exposed to someone else with strep and reports that he knows he has it  Current Medication: Outpatient Encounter Medications as of 04/12/2022  Medication Sig   dapagliflozin propanediol (FARXIGA) 10 MG TABS tablet Take 1 tablet (10 mg total) by mouth daily before breakfast.   diltiazem (CARDIZEM CD) 240 MG 24 hr capsule Take 1 capsule (240 mg total) by mouth every morning.   HUMALOG KWIKPEN 100 UNIT/ML KwikPen 2-4 Units Sub 3 times daily PRN, max unit 14 units daily  only if blood sugar is above 100, with meal per sliding scale   Insulin Pen Needle (PEN NEEDLES) 30G X 5 MM MISC 1 each by Does not apply route 4 (four) times daily as needed. E11.65   lisinopril (ZESTRIL) 2.5 MG tablet Take 1 tablet (2.5 mg total) by mouth daily.   lovastatin (MEVACOR) 20 MG tablet Take 1 tablet (20 mg total) by mouth at bedtime.   mupirocin ointment (BACTROBAN) 2 % Apply small amount to effected area twice daily for one week.   NOVOLOG FLEXPEN 100 UNIT/ML FlexPen INJECT 2-4 UNITS UNDER THE SKIN THREE TIMES A DAY  AS NEEDED WITH MEALS PER SLIDING SCALE. MAX 14 UNITS DAILY ONLY IF BLOOD SUGAR IS ABOVE 100.   OneTouch Delica Lancets 33L MISC Use as directed 4 times a daily Dx E11.65   Semaglutide, 2 MG/DOSE, 8 MG/3ML SOPN Inject 2 mg into the skin once a week.   SEMGLEE, YFGN, 100 UNIT/ML Pen INJECT 50 UNITS INTO THE SKIN DAILY   sildenafil (REVATIO) 20 MG tablet Take 1 tablet (20 mg total) by mouth daily as needed.   [DISCONTINUED] amoxicillin (AMOXIL) 500 MG capsule Take 1 capsule (500 mg total) by mouth 2 (two) times daily for 10 days. Take with food   Tdap (BOOSTRIX) 5-2.5-18.5 LF-MCG/0.5 injection Inject 0.5 mLs into the muscle once for 1 dose.   No facility-administered encounter medications on file as of 04/12/2022.    Surgical History: Past Surgical History:  Procedure Laterality Date   COLONOSCOPY WITH PROPOFOL N/A 12/18/2017   Procedure: COLONOSCOPY WITH PROPOFOL;  Surgeon: Jonathon Bellows, MD;  Location: [facility] ENDOSCOPY;  Service: Gastroenterology;  Laterality: N/A;   RECTAL EXAM UNDER ANESTHESIA N/A 05/24/2018   Procedure: RECTAL EXAM UNDER ANESTHESIA WITH POSSIBLE POLYPECTOMY;  Surgeon: Vickie Epley, MD;  Location: [facility] ORS;  Service: General;  Laterality: N/A;    Medical History: Past Medical History:  Diagnosis Date   Arthritis    Atrial fibrillation ([place]) 1993   Diabetes mellitus without complication ([place])    History of kidney stones  Hypercholesteremia    Hypertension     Family History: Family History  Problem Relation Age of Onset   Diabetes Mother    Diabetes Father    Hypertension Father     Social History   Socioeconomic History   Marital status: Single    Spouse name: Not on file   Number of children: Not on file   Years of education: Not on file   Highest education level: Not on file  Occupational History   Not on file  Tobacco Use   Smoking status: Never   Smokeless tobacco: Never  Vaping Use   Vaping Use: Never used  Substance and Sexual Activity    Alcohol use: No    Alcohol/week: 0.0 standard drinks of alcohol   Drug use: No   Sexual activity: Not on file  Other Topics Concern   Not on file  Social History Narrative   Not on file   Social Determinants of Health   Financial Resource Strain: Not on file  Food Insecurity: Not on file  Transportation Needs: Not on file  Physical Activity: Not on file  Stress: Not on file  Social Connections: Not on file  Intimate Partner Violence: Not on file      Review of Systems  Constitutional:  Positive for appetite change and fatigue.  HENT:  Positive for congestion, postnasal drip, sinus pressure, sinus pain, sore throat and trouble swallowing.   Respiratory:  Positive for cough. Negative for chest tightness, shortness of breath and wheezing.   Cardiovascular: Negative.  Negative for chest pain and palpitations.  Neurological:  Positive for headaches.    Vital Signs: Resp 16   Ht 5' 11.5" (1.816 m)   Wt 284 lb (128.8 kg)   BMI 39.06 kg/m    Observation/Objective: He is alert and oriented and engages in conversation appropriately. He does not sound as though he is in any acute distress over telephone call today.    Assessment/Plan: 1. Acute bacterial pharyngitis Amoxicillin prescribed  2. Sore throat Amoxicillin prescribed x10 days   General Counseling: Brett Wiley verbalizes understanding of the findings of today's phone visit and agrees with plan of treatment. I have discussed any further diagnostic evaluation that may be needed or ordered today. We also reviewed his medications today. he has been encouraged to call the office with any questions or concerns that should arise related to todays visit.  No follow-ups on file.   No orders of the defined types were placed in this encounter.   Meds ordered this encounter  Medications   DISCONTD: amoxicillin (AMOXIL) 500 MG capsule    Sig: Take 1 capsule (500 mg total) by mouth 2 (two) times daily for 10 days. Take with  food    Dispense:  20 capsule    Refill:  0    Please fill asap    Time spent:10 Minutes Time spent with patient included reviewing progress notes, labs, imaging studies, and discussing plan for follow up.  [place] Controlled Substance Database was reviewed by me for overdose risk score (ORS) if appropriate.  This patient was seen by Jonetta Osgood, FNP-C in collaboration with Dr. Clayborn Bigness as a part of collaborative care agreement.  Shuntae Herzig R. Valetta Fuller, MSN, FNP-C Internal medicine

## 2022-04-13 NOTE — Telephone Encounter (Signed)
Done  had visit yesterday

## 2022-04-14 ENCOUNTER — Encounter: Payer: BC Managed Care – PPO | Admitting: Nurse Practitioner

## 2022-04-24 DIAGNOSIS — H524 Presbyopia: Secondary | ICD-10-CM | POA: Diagnosis not present

## 2022-04-24 DIAGNOSIS — H2513 Age-related nuclear cataract, bilateral: Secondary | ICD-10-CM | POA: Diagnosis not present

## 2022-04-24 DIAGNOSIS — E133293 Other specified diabetes mellitus with mild nonproliferative diabetic retinopathy without macular edema, bilateral: Secondary | ICD-10-CM | POA: Diagnosis not present

## 2022-04-24 DIAGNOSIS — H5213 Myopia, bilateral: Secondary | ICD-10-CM | POA: Diagnosis not present

## 2022-04-24 DIAGNOSIS — H52223 Regular astigmatism, bilateral: Secondary | ICD-10-CM | POA: Diagnosis not present

## 2022-04-27 ENCOUNTER — Ambulatory Visit: Payer: BC Managed Care – PPO | Admitting: Nurse Practitioner

## 2022-05-01 ENCOUNTER — Encounter: Payer: Self-pay | Admitting: Nurse Practitioner

## 2022-05-01 ENCOUNTER — Ambulatory Visit (INDEPENDENT_AMBULATORY_CARE_PROVIDER_SITE_OTHER): Payer: BC Managed Care – PPO | Admitting: Nurse Practitioner

## 2022-05-01 VITALS — BP 105/65 | HR 85 | Temp 97.1°F | Resp 16 | Ht 71.5 in | Wt 281.2 lb

## 2022-05-01 DIAGNOSIS — I10 Essential (primary) hypertension: Secondary | ICD-10-CM

## 2022-05-01 DIAGNOSIS — E1165 Type 2 diabetes mellitus with hyperglycemia: Secondary | ICD-10-CM

## 2022-05-01 DIAGNOSIS — E661 Drug-induced obesity: Secondary | ICD-10-CM | POA: Diagnosis not present

## 2022-05-01 DIAGNOSIS — Z6838 Body mass index (BMI) 38.0-38.9, adult: Secondary | ICD-10-CM

## 2022-05-01 DIAGNOSIS — Z794 Long term (current) use of insulin: Secondary | ICD-10-CM

## 2022-05-01 DIAGNOSIS — E66812 Obesity, class 2: Secondary | ICD-10-CM

## 2022-05-01 DIAGNOSIS — J029 Acute pharyngitis, unspecified: Secondary | ICD-10-CM | POA: Diagnosis not present

## 2022-05-01 LAB — POCT GLYCOSYLATED HEMOGLOBIN (HGB A1C): Hemoglobin A1C: 5.8 % — AB (ref 4.0–5.6)

## 2022-05-01 MED ORDER — AMOXICILLIN 500 MG PO CAPS: 500.0000 mg | ORAL_CAPSULE | Freq: Two times a day (BID) | ORAL | 0 refills | Status: AC

## 2022-05-01 NOTE — Progress Notes (Signed)
[facility] [place], [place] [address]  Internal MEDICINE  Office Visit Note  Patient Name: Brett Wiley  [id number]  [id number]  Date of Service: 05/01/2022  Chief Complaint  Patient presents with   Follow-up   Diabetes   Hypertension   Hyperlipidemia    HPI Demetrias presents for follow-up visit for diabetes, hypertension and hyperlipidemia.   --A1c is 5.8 today improved from 6.1 in august   --blood pressure stable on current medications  --takes statin for high cholesterol.   --has lost 3 more lbs for a total of 28 lbs since September last year.  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   diltiazem (CARDIZEM CD) 240 MG 24 hr capsule Take 1 capsule (240 mg total) by mouth every morning.   HUMALOG KWIKPEN 100 UNIT/ML KwikPen 2-4 Units Sub 3 times daily PRN, max unit 14 units daily  only if blood sugar is above 100, with meal per sliding scale   Insulin Pen Needle (PEN NEEDLES) 30G X 5 MM MISC 1 each by Does not apply route 4 (four) times daily as needed. E11.65   lisinopril (ZESTRIL) 2.5 MG tablet Take 1 tablet (2.5 mg total) by mouth daily.   lovastatin (MEVACOR) 20 MG tablet Take 1 tablet (20 mg total) by mouth at bedtime.   mupirocin ointment (BACTROBAN) 2 % Apply small amount to effected area twice daily for one week.   NOVOLOG FLEXPEN 100 UNIT/ML FlexPen INJECT 2-4 UNITS UNDER THE SKIN THREE TIMES A DAY AS NEEDED WITH MEALS PER SLIDING SCALE. MAX 14 UNITS DAILY ONLY IF BLOOD SUGAR IS ABOVE 100.   OneTouch Delica Lancets 96V MISC Use as directed 4 times a daily Dx E11.65   Semaglutide, 2 MG/DOSE, 8 MG/3ML SOPN Inject 2 mg into the skin once a week.   SEMGLEE, YFGN, 100 UNIT/ML Pen INJECT 50 UNITS INTO THE SKIN DAILY   sildenafil  (REVATIO) 20 MG tablet Take 1 tablet (20 mg total) by mouth daily as needed.   amoxicillin (AMOXIL) 500 MG capsule Take 1 capsule (500 mg total) by mouth 2 (two) times daily for 10 days. Take with food   Tdap (BOOSTRIX) 5-2.5-18.5 LF-MCG/0.5 injection Inject 0.5 mLs into the muscle once for 1 dose.   No facility-administered encounter medications on file as of 05/01/2022.    Surgical History: Past Surgical History:  Procedure Laterality Date   COLONOSCOPY WITH PROPOFOL N/A 12/18/2017   Procedure: COLONOSCOPY WITH PROPOFOL;  Surgeon: Jonathon Bellows, MD;  Location: [facility] ENDOSCOPY;  Service: Gastroenterology;  Laterality: N/A;   RECTAL EXAM UNDER ANESTHESIA N/A 05/24/2018   Procedure: RECTAL EXAM UNDER ANESTHESIA WITH POSSIBLE POLYPECTOMY;  Surgeon: Vickie Epley, MD;  Location: [facility] ORS;  Service: General;  Laterality: N/A;    Medical History: Past Medical History:  Diagnosis Date   Arthritis    Atrial fibrillation ([place]) 1993   Diabetes mellitus without complication ([facility])    History of kidney stones    Hypercholesteremia    Hypertension     Family History: Family History  Problem Relation Age of Onset   Diabetes Mother    Diabetes Father    Hypertension Father     Social History   Socioeconomic History   Marital status: Single    Spouse name:  Not on file   Number of children: Not on file   Years of education: Not on file   Highest education level: Not on file  Occupational History   Not on file  Tobacco Use   Smoking status: Never   Smokeless tobacco: Never  Vaping Use   Vaping Use: Never used  Substance and Sexual Activity   Alcohol use: No    Alcohol/week: 0.0 standard drinks of alcohol   Drug use: No   Sexual activity: Not on file  Other Topics Concern   Not on file  Social History Narrative   Not on file   Social Determinants of Health   Financial Resource Strain: Not on file  Food Insecurity: Not on file  Transportation Needs: Not on file  Physical  Activity: Not on file  Stress: Not on file  Social Connections: Not on file  Intimate Partner Violence: Not on file      Review of Systems  Constitutional:  Negative for chills, fatigue and unexpected weight change.  HENT:  Negative for congestion, rhinorrhea, sneezing and sore throat.   Eyes:  Negative for redness.  Respiratory:  Negative for cough, chest tightness and shortness of breath.   Cardiovascular:  Negative for chest pain and palpitations.  Gastrointestinal:  Negative for abdominal pain, constipation, diarrhea, nausea and vomiting.  Genitourinary:  Negative for dysuria and frequency.  Musculoskeletal:  Negative for arthralgias, back pain, joint swelling and neck pain.  Skin:  Negative for rash.  Neurological: Negative.  Negative for tremors and numbness.  Hematological:  Negative for adenopathy. Does not bruise/bleed easily.  Psychiatric/Behavioral:  Negative for behavioral problems (Depression), sleep disturbance and suicidal ideas. The patient is not nervous/anxious.     Vital Signs: BP 105/65   Pulse 85   Temp (!) 97.1 F (36.2 C)   Resp 16   Ht 5' 11.5" (1.816 m)   Wt 281 lb 3.2 oz (127.6 kg)   SpO2 97%   BMI 38.67 kg/m    Physical Exam Vitals reviewed.  Constitutional:      General: He is not in acute distress.    Appearance: Normal appearance. He is morbidly obese. He is not ill-appearing.  HENT:     Head: Normocephalic and atraumatic.  Eyes:     Pupils: Pupils are equal, round, and reactive to light.  Cardiovascular:     Rate and Rhythm: Normal rate and regular rhythm.  Pulmonary:     Effort: Pulmonary effort is normal. No respiratory distress.  Neurological:     Mental Status: He is alert and oriented to person, place, and time.  Psychiatric:        Mood and Affect: Mood normal.        Behavior: Behavior normal.        Assessment/Plan: 1. Type 2 diabetes mellitus with hyperglycemia, with [place]-term current use of insulin (HCC) Continuing  to improve, repeat A1c in 3 months - POCT glycosylated hemoglobin (Hb A1C)  2. Essential hypertension Stable, continue medications as prescribed.   3. Class 2 drug-induced obesity with serious comorbidity and body mass index (BMI) of 38.0 to 38.9 in adult Decreased BMI to less than 40, continuing to lose weight. Great job!, keep it up!  4. Sore throat Wants to keep extra script for amoxicillin on hand in case he gets strep throat again.  - amoxicillin (AMOXIL) 500 MG capsule; Take 1 capsule (500 mg total) by mouth 2 (two) times daily for 10 days. Take with food  Dispense:  20 capsule; Refill: 0   General Counseling: Manoj verbalizes understanding of the findings of todays visit and agrees with plan of treatment. I have discussed any further diagnostic evaluation that may be needed or ordered today. We also reviewed his medications today. he has been encouraged to call the office with any questions or concerns that should arise related to todays visit.    Orders Placed This Encounter  Procedures   POCT glycosylated hemoglobin (Hb A1C)    Meds ordered this encounter  Medications   amoxicillin (AMOXIL) 500 MG capsule    Sig: Take 1 capsule (500 mg total) by mouth 2 (two) times daily for 10 days. Take with food    Dispense:  20 capsule    Refill:  0    Return in about 3 months (around 08/09/2022) for F/U, Recheck A1C, Lawrie Tunks PCP.   Total time spent:30 Minutes Time spent includes review of chart, medications, test results, and follow up plan with the patient.   [place] Controlled Substance Database was reviewed by me.  This patient was seen by Jonetta Osgood, FNP-C in collaboration with Dr. Clayborn Bigness as a part of collaborative care agreement.   Daegon Deiss R. Valetta Fuller, MSN, FNP-C Internal medicine

## 2022-05-05 ENCOUNTER — Encounter: Payer: Self-pay | Admitting: Nurse Practitioner

## 2022-05-07 ENCOUNTER — Encounter: Payer: Self-pay | Admitting: Nurse Practitioner

## 2022-05-09 ENCOUNTER — Encounter: Payer: BC Managed Care – PPO | Admitting: Nurse Practitioner

## 2022-08-01 ENCOUNTER — Ambulatory Visit (INDEPENDENT_AMBULATORY_CARE_PROVIDER_SITE_OTHER): Payer: BC Managed Care – PPO | Admitting: Nurse Practitioner

## 2022-08-01 ENCOUNTER — Encounter: Payer: Self-pay | Admitting: Nurse Practitioner

## 2022-08-01 VITALS — BP 121/68 | HR 80 | Temp 98.3°F | Resp 16 | Ht 71.5 in | Wt 286.0 lb

## 2022-08-01 DIAGNOSIS — E782 Mixed hyperlipidemia: Secondary | ICD-10-CM

## 2022-08-01 DIAGNOSIS — I10 Essential (primary) hypertension: Secondary | ICD-10-CM | POA: Diagnosis not present

## 2022-08-01 DIAGNOSIS — E1165 Type 2 diabetes mellitus with hyperglycemia: Secondary | ICD-10-CM

## 2022-08-01 DIAGNOSIS — Z794 Long term (current) use of insulin: Secondary | ICD-10-CM

## 2022-08-01 DIAGNOSIS — Z23 Encounter for immunization: Secondary | ICD-10-CM

## 2022-08-01 MED ORDER — TETANUS-DIPHTH-ACELL PERTUSSIS 5-2.5-18.5 LF-MCG/0.5 IM SUSP: 0.5000 mL | Freq: Once | INTRAMUSCULAR | 0 refills | Status: DC

## 2022-08-01 MED ORDER — LOVASTATIN 20 MG PO TABS: 20.0000 mg | ORAL_TABLET | Freq: Every day | ORAL | 3 refills | Status: DC

## 2022-08-01 MED ORDER — LISINOPRIL 2.5 MG PO TABS: 2.5000 mg | ORAL_TABLET | Freq: Every day | ORAL | 1 refills | Status: DC

## 2022-08-01 MED ORDER — ZOSTER VAC RECOMB ADJUVANTED 50 MCG/0.5ML IM SUSR: 0.5000 mL | Freq: Once | INTRAMUSCULAR | 0 refills | Status: AC

## 2022-08-01 MED ORDER — DILTIAZEM HCL ER COATED BEADS 240 MG PO CP24: 240.0000 mg | ORAL_CAPSULE | Freq: Every morning | ORAL | 3 refills | Status: DC

## 2022-08-01 NOTE — Progress Notes (Unsigned)
[facility] [place], [place] [address]  Internal MEDICINE  Office Visit Note  Patient Name: Brett Wiley  [id number]  [id number]  Date of Service: 08/02/2022  Chief Complaint  Patient presents with   Follow-up    HPI Brett Wiley presents for a follow-up visit for diabetes and hypertension.  Last A1c was 5.8 in November. Stable, will check ac again in 3 more months.  Gained 5 lbs over holidays  Has been drinking more soda Refills due  Blood pressure is controlled with current medications.    Current Medication: Outpatient Encounter Medications as of 08/01/2022  Medication Sig   dapagliflozin propanediol (FARXIGA) 10 MG TABS tablet Take 1 tablet (10 mg total) by mouth daily before breakfast.   HUMALOG KWIKPEN 100 UNIT/ML KwikPen 2-4 Units Sub 3 times daily PRN, max unit 14 units daily  only if blood sugar is above 100, with meal per sliding scale   Insulin Pen Needle (PEN NEEDLES) 30G X 5 MM MISC 1 each by Does not apply route 4 (four) times daily as needed. E11.65   mupirocin ointment (BACTROBAN) 2 % Apply small amount to effected area twice daily for one week.   NOVOLOG FLEXPEN 100 UNIT/ML FlexPen INJECT 2-4 UNITS UNDER THE SKIN THREE TIMES A DAY AS NEEDED WITH MEALS PER SLIDING SCALE. MAX 14 UNITS DAILY ONLY IF BLOOD SUGAR IS ABOVE 100.   OneTouch Delica Lancets 99991111 MISC Use as directed 4 times a daily Dx E11.65   Semaglutide, 2 MG/DOSE, 8 MG/3ML SOPN Inject 2 mg into the skin once a week.   SEMGLEE, YFGN, 100 UNIT/ML Pen INJECT 50 UNITS INTO THE SKIN DAILY   sildenafil (REVATIO) 20 MG tablet Take 1 tablet (20 mg total) by mouth daily as needed.   [DISCONTINUED] diltiazem (CARDIZEM CD) 240 MG 24 hr capsule Take 1 capsule (240 mg total) by mouth every morning.   [DISCONTINUED] lisinopril (ZESTRIL) 2.5 MG tablet Take 1 tablet (2.5 mg total) by mouth daily.   [DISCONTINUED] lovastatin (MEVACOR) 20 MG tablet Take 1 tablet (20 mg total) by mouth at bedtime.    [DISCONTINUED] Tdap (BOOSTRIX) 5-2.5-18.5 LF-MCG/0.5 injection Inject 0.5 mLs into the muscle once.   [DISCONTINUED] Zoster Vaccine Adjuvanted [facility]) injection Inject 0.5 mLs into the muscle once.   diltiazem (CARDIZEM CD) 240 MG 24 hr capsule Take 1 capsule (240 mg total) by mouth every morning.   lisinopril (ZESTRIL) 2.5 MG tablet Take 1 tablet (2.5 mg total) by mouth daily.   lovastatin (MEVACOR) 20 MG tablet Take 1 tablet (20 mg total) by mouth at bedtime.   Tdap (BOOSTRIX) 5-2.5-18.5 LF-MCG/0.5 injection Inject 0.5 mLs into the muscle once for 1 dose.   Tdap (BOOSTRIX) 5-2.5-18.5 LF-MCG/0.5 injection Inject 0.5 mLs into the muscle once for 1 dose.   [EXPIRED] Zoster Vaccine Adjuvanted [facility]) injection Inject 0.5 mLs into the muscle once for 1 dose.   No facility-administered encounter medications on file as of 08/01/2022.    Surgical History: Past Surgical History:  Procedure Laterality Date   COLONOSCOPY WITH PROPOFOL N/A 12/18/2017   Procedure: COLONOSCOPY WITH PROPOFOL;  Surgeon: Jonathon Bellows, MD;  Location: [facility] ENDOSCOPY;  Service: Gastroenterology;  Laterality: N/A;   RECTAL EXAM UNDER ANESTHESIA N/A 05/24/2018   Procedure: RECTAL EXAM UNDER ANESTHESIA WITH POSSIBLE POLYPECTOMY;  Surgeon: Vickie Epley, MD;  Location: [facility] ORS;  Service: General;  Laterality: N/A;    Medical History: Past Medical History:  Diagnosis Date   Arthritis    Atrial fibrillation (  [place]) 1993   Diabetes mellitus without complication ([place])    History of kidney stones    Hypercholesteremia    Hypertension     Family History: Family History  Problem Relation Age of Onset   Diabetes Mother    Diabetes Father    Hypertension Father     Social History   Socioeconomic History   Marital status: Single    Spouse name: Not on file   Number of children: Not on file   Years of education: Not on file   Highest education level: Not on file  Occupational History   Not on file  Tobacco Use    Smoking status: Never   Smokeless tobacco: Never  Vaping Use   Vaping Use: Never used  Substance and Sexual Activity   Alcohol use: No    Alcohol/week: 0.0 standard drinks of alcohol   Drug use: No   Sexual activity: Not on file  Other Topics Concern   Not on file  Social History Narrative   Not on file   Social Determinants of Health   Financial Resource Strain: Not on file  Food Insecurity: Not on file  Transportation Needs: Not on file  Physical Activity: Not on file  Stress: Not on file  Social Connections: Not on file  Intimate Partner Violence: Not on file      Review of Systems  Constitutional:  Negative for chills, fatigue and unexpected weight change.  HENT:  Negative for congestion, rhinorrhea, sneezing and sore throat.   Eyes:  Negative for redness.  Respiratory:  Negative for cough, chest tightness and shortness of breath.   Cardiovascular:  Negative for chest pain and palpitations.  Gastrointestinal:  Negative for abdominal pain, constipation, diarrhea, nausea and vomiting.  Genitourinary:  Negative for dysuria and frequency.  Musculoskeletal:  Negative for arthralgias, back pain, joint swelling and neck pain.  Skin:  Negative for rash.  Neurological: Negative.  Negative for tremors and numbness.  Hematological:  Negative for adenopathy. Does not bruise/bleed easily.  Psychiatric/Behavioral:  Negative for behavioral problems (Depression), sleep disturbance and suicidal ideas. The patient is not nervous/anxious.     Vital Signs: BP 121/68   Pulse 80   Temp 98.3 F (36.8 C)   Resp 16   Ht 5' 11.5" (1.816 m)   Wt 286 lb (129.7 kg)   SpO2 96%   BMI 39.33 kg/m    Physical Exam Vitals reviewed.  Constitutional:      General: He is not in acute distress.    Appearance: Normal appearance. He is morbidly obese. He is not ill-appearing.  HENT:     Head: Normocephalic and atraumatic.  Eyes:     Pupils: Pupils are equal, round, and reactive to light.   Cardiovascular:     Rate and Rhythm: Normal rate and regular rhythm.  Pulmonary:     Effort: Pulmonary effort is normal. No respiratory distress.  Neurological:     Mental Status: He is alert and oriented to person, place, and time.  Psychiatric:        Mood and Affect: Mood normal.        Behavior: Behavior normal.        Assessment/Plan: 1. Type 2 diabetes mellitus with hyperglycemia, with [place]-term current use of insulin (HCC) Stable, continue medications as prescribed. Repeat A1c in 3 months.   2. Essential hypertension Stable, continue medications as prescribed, refills ordered.  - lisinopril (ZESTRIL) 2.5 MG tablet; Take 1 tablet (2.5 mg total) by mouth  daily.  Dispense: 90 tablet; Refill: 1 - diltiazem (CARDIZEM CD) 240 MG 24 hr capsule; Take 1 capsule (240 mg total) by mouth every morning.  Dispense: 90 capsule; Refill: 3  3. Mixed hyperlipidemia Continue lovastatin as prescribed.  - lovastatin (MEVACOR) 20 MG tablet; Take 1 tablet (20 mg total) by mouth at bedtime.  Dispense: 90 tablet; Refill: 3  4. Need for vaccination - Tdap ([place]) 5-2.5-18.5 LF-MCG/0.5 injection; Inject 0.5 mLs into the muscle once for 1 dose.  Dispense: 0.5 mL; Refill: 0 - Zoster Vaccine Adjuvanted [facility]) injection; Inject 0.5 mLs into the muscle once for 1 dose.  Dispense: 0.5 mL; Refill: 0   General Counseling: Jwan verbalizes understanding of the findings of todays visit and agrees with plan of treatment. I have discussed any further diagnostic evaluation that may be needed or ordered today. We also reviewed his medications today. he has been encouraged to call the office with any questions or concerns that should arise related to todays visit.    No orders of the defined types were placed in this encounter.   Meds ordered this encounter  Medications   Tdap (BOOSTRIX) 5-2.5-18.5 LF-MCG/0.5 injection    Sig: Inject 0.5 mLs into the muscle once for 1 dose.    Dispense:  0.5 mL     Refill:  0   Zoster Vaccine Adjuvanted [facility]) injection    Sig: Inject 0.5 mLs into the muscle once for 1 dose.    Dispense:  0.5 mL    Refill:  0   lisinopril (ZESTRIL) 2.5 MG tablet    Sig: Take 1 tablet (2.5 mg total) by mouth daily.    Dispense:  90 tablet    Refill:  1    For future refills   diltiazem (CARDIZEM CD) 240 MG 24 hr capsule    Sig: Take 1 capsule (240 mg total) by mouth every morning.    Dispense:  90 capsule    Refill:  3    For future refills   lovastatin (MEVACOR) 20 MG tablet    Sig: Take 1 tablet (20 mg total) by mouth at bedtime.    Dispense:  90 tablet    Refill:  3    For future refills    Return in about 3 months (around 10/30/2022) for F/U, Recheck A1C, Alexcis Bicking PCP .   Total time spent:30 Minutes Time spent includes review of chart, medications, test results, and follow up plan with the patient.   [place] Controlled Substance Database was reviewed by me.  This patient was seen by Jonetta Osgood, FNP-C in collaboration with Dr. Clayborn Bigness as a part of collaborative care agreement.   Jaqlyn Gruenhagen R. Valetta Fuller, MSN, FNP-C Internal medicine

## 2022-08-02 ENCOUNTER — Encounter: Payer: Self-pay | Admitting: Nurse Practitioner

## 2022-08-12 ENCOUNTER — Other Ambulatory Visit: Payer: Self-pay | Admitting: Nurse Practitioner

## 2022-08-12 DIAGNOSIS — E1165 Type 2 diabetes mellitus with hyperglycemia: Secondary | ICD-10-CM

## 2022-08-12 DIAGNOSIS — Z794 Long term (current) use of insulin: Secondary | ICD-10-CM

## 2022-08-14 NOTE — Telephone Encounter (Signed)
Please review its ok to send

## 2022-08-26 ENCOUNTER — Other Ambulatory Visit: Payer: Self-pay | Admitting: Nurse Practitioner

## 2022-08-26 DIAGNOSIS — E1165 Type 2 diabetes mellitus with hyperglycemia: Secondary | ICD-10-CM

## 2022-10-07 ENCOUNTER — Other Ambulatory Visit: Payer: Self-pay | Admitting: Nurse Practitioner

## 2022-10-07 DIAGNOSIS — E1165 Type 2 diabetes mellitus with hyperglycemia: Secondary | ICD-10-CM

## 2022-10-23 DIAGNOSIS — H2513 Age-related nuclear cataract, bilateral: Secondary | ICD-10-CM | POA: Diagnosis not present

## 2022-10-23 DIAGNOSIS — H5213 Myopia, bilateral: Secondary | ICD-10-CM | POA: Diagnosis not present

## 2022-10-23 DIAGNOSIS — H524 Presbyopia: Secondary | ICD-10-CM | POA: Diagnosis not present

## 2022-10-23 DIAGNOSIS — E133293 Other specified diabetes mellitus with mild nonproliferative diabetic retinopathy without macular edema, bilateral: Secondary | ICD-10-CM | POA: Diagnosis not present

## 2022-11-01 ENCOUNTER — Ambulatory Visit (INDEPENDENT_AMBULATORY_CARE_PROVIDER_SITE_OTHER): Payer: BC Managed Care – PPO | Admitting: Nurse Practitioner

## 2022-11-01 ENCOUNTER — Encounter: Payer: Self-pay | Admitting: Nurse Practitioner

## 2022-11-01 VITALS — BP 130/84 | HR 80 | Temp 98.4°F | Resp 16 | Ht 71.5 in | Wt 282.6 lb

## 2022-11-01 DIAGNOSIS — Z6838 Body mass index (BMI) 38.0-38.9, adult: Secondary | ICD-10-CM

## 2022-11-01 DIAGNOSIS — E782 Mixed hyperlipidemia: Secondary | ICD-10-CM | POA: Diagnosis not present

## 2022-11-01 DIAGNOSIS — E661 Drug-induced obesity: Secondary | ICD-10-CM

## 2022-11-01 DIAGNOSIS — Z794 Long term (current) use of insulin: Secondary | ICD-10-CM | POA: Diagnosis not present

## 2022-11-01 DIAGNOSIS — E1165 Type 2 diabetes mellitus with hyperglycemia: Secondary | ICD-10-CM | POA: Diagnosis not present

## 2022-11-01 DIAGNOSIS — I10 Essential (primary) hypertension: Secondary | ICD-10-CM | POA: Diagnosis not present

## 2022-11-01 LAB — POCT GLYCOSYLATED HEMOGLOBIN (HGB A1C): Hemoglobin A1C: 6.1 % — AB (ref 4.0–5.6)

## 2022-11-01 NOTE — Progress Notes (Signed)
[facility] [address] [place], [place] [address]  Internal MEDICINE  Office Visit Note  Patient Name: Brett Wiley  [id number]  [id number]  Date of Service: 11/01/2022  Chief Complaint  Patient presents with   Diabetes   Hypertension   Hyperlipidemia   Follow-up    HPI Aarion presents for a follow-up visit for diabetes, hypertension, high cholesterol and weight loss.  Diabetes -- A1c is 6.1 today, slight increase but remains stable. Lost 4 lbs since last office visit.  Hypertension -- BP controlled with current medications. Taking diltiazem and lisinopril.  High cholesterol -- taking lovastatin. No issues Weight loss -- on 2 mg ozempic, doing well. Was slightly relaxed with his diet but was still able to lose a few more lbs and keep his A1c stable. Overall he is doing well and is gradually losing weight.   Current Medication: Outpatient Encounter Medications as of 11/01/2022  Medication Sig   diltiazem (CARDIZEM CD) 240 MG 24 hr capsule Take 1 capsule (240 mg total) by mouth every morning.   FARXIGA 10 MG TABS tablet TAKE ONE TABLET BY MOUTH DAILY BEFORE BREAKFAST   HUMALOG KWIKPEN 100 UNIT/ML KwikPen 2-4 Units Sub 3 times daily PRN, max unit 14 units daily  only if blood sugar is above 100, with meal per sliding scale   Insulin Pen Needle (PEN NEEDLES) 30G X 5 MM MISC 1 each by Does not apply route 4 (four) times daily as needed. E11.65   lisinopril (ZESTRIL) 2.5 MG tablet Take 1 tablet (2.5 mg total) by mouth daily.   lovastatin (MEVACOR) 20 MG tablet Take 1 tablet (20 mg total) by mouth at bedtime.   mupirocin ointment (BACTROBAN) 2 % Apply small amount to effected area twice daily for one week.   NOVOLOG FLEXPEN 100 UNIT/ML FlexPen INJECT 2-4 UNITS UNDER THE SKIN THREE TIMES A DAY AS NEEDED WITH MEALS PER SLIDING SCALE. MAX 14 UNITS DAILY ONLY IF BLOOD SUGAR IS ABOVE 100.   OneTouch Delica Lancets 30G MISC Use as directed 4 times a daily Dx E11.65    Semaglutide, 2 MG/DOSE, (OZEMPIC, 2 MG/DOSE,) 8 MG/3ML SOPN DIAL AND INJECT UNDER THE SKIN 2 MG WEEKLY   SEMGLEE, YFGN, 100 UNIT/ML Pen INJECT 50 UNITS INTO THE SKIN DAILY   sildenafil (REVATIO) 20 MG tablet Take 1 tablet (20 mg total) by mouth daily as needed.   [DISCONTINUED] Tdap (BOOSTRIX) 5-2.5-18.5 LF-MCG/0.5 injection Inject 0.5 mLs into the muscle once for 1 dose.   [DISCONTINUED] Tdap (BOOSTRIX) 5-2.5-18.5 LF-MCG/0.5 injection Inject 0.5 mLs into the muscle once for 1 dose.   No facility-administered encounter medications on file as of 11/01/2022.    Surgical History: Past Surgical History:  Procedure Laterality Date   COLONOSCOPY WITH PROPOFOL N/A 12/18/2017   Procedure: COLONOSCOPY WITH PROPOFOL;  Surgeon: Wyline Mood, MD;  Location: [facility] ENDOSCOPY;  Service: Gastroenterology;  Laterality: N/A;   RECTAL EXAM UNDER ANESTHESIA N/A 05/24/2018   Procedure: RECTAL EXAM UNDER ANESTHESIA WITH POSSIBLE POLYPECTOMY;  Surgeon: Ancil Linsey, MD;  Location: [facility] ORS;  Service: General;  Laterality: N/A;    Medical History: Past Medical History:  Diagnosis Date   Arthritis    Atrial fibrillation (HCC) 1993   Diabetes mellitus without complication (HCC)    History of kidney stones    Hypercholesteremia    Hypertension     Family History: Family History  Problem Relation Age of Onset   Diabetes Mother    Diabetes Father    Hypertension  Father     Social History   Socioeconomic History   Marital status: Single    Spouse name: Not on file   Number of children: Not on file   Years of education: Not on file   Highest education level: Not on file  Occupational History   Not on file  Tobacco Use   Smoking status: Never   Smokeless tobacco: Never  Vaping Use   Vaping Use: Never used  Substance and Sexual Activity   Alcohol use: No    Alcohol/week: 0.0 standard drinks of alcohol   Drug use: No   Sexual activity: Not on file  Other Topics Concern   Not on file  Social  History Narrative   Not on file   Social Determinants of Health   Financial Resource Strain: Not on file  Food Insecurity: Not on file  Transportation Needs: Not on file  Physical Activity: Not on file  Stress: Not on file  Social Connections: Not on file  Intimate Partner Violence: Not on file      Review of Systems  Constitutional:  Negative for chills, fatigue and unexpected weight change.  HENT:  Negative for congestion, rhinorrhea, sneezing and sore throat.   Eyes:  Negative for redness.  Respiratory:  Negative for cough, chest tightness and shortness of breath.   Cardiovascular:  Negative for chest pain and palpitations.  Gastrointestinal:  Negative for abdominal pain, constipation, diarrhea, nausea and vomiting.  Genitourinary:  Negative for dysuria and frequency.  Musculoskeletal:  Negative for arthralgias, back pain, joint swelling and neck pain.  Skin:  Negative for rash.  Neurological: Negative.  Negative for tremors and numbness.  Hematological:  Negative for adenopathy. Does not bruise/bleed easily.  Psychiatric/Behavioral:  Negative for behavioral problems (Depression), sleep disturbance and suicidal ideas. The patient is not nervous/anxious.     Vital Signs: BP 130/84   Pulse 80   Temp 98.4 F (36.9 C)   Resp 16   Ht 5' 11.5" (1.816 m)   Wt 282 lb 9.6 oz (128.2 kg)   SpO2 96%   BMI 38.87 kg/m    Physical Exam Vitals reviewed.  Constitutional:      General: He is not in acute distress.    Appearance: Normal appearance. He is morbidly obese. He is not ill-appearing.  HENT:     Head: Normocephalic and atraumatic.  Eyes:     Pupils: Pupils are equal, round, and reactive to light.  Cardiovascular:     Rate and Rhythm: Normal rate and regular rhythm.  Pulmonary:     Effort: Pulmonary effort is normal. No respiratory distress.  Neurological:     Mental Status: He is alert and oriented to person, place, and time.  Psychiatric:        Mood and Affect:  Mood normal.        Behavior: Behavior normal.        Assessment/Plan: 1. Type 2 diabetes mellitus with hyperglycemia, with [place]-term current use of insulin (HCC) Stable, A1c 6.1, continue farxiga, ozempic and insulin as prescribed. Follow up in 6 months for repeat A1c.  - POCT glycosylated hemoglobin (Hb A1C)  2. Essential hypertension Stable, continue lisinopril and diltiazem as prescribed.   3. Mixed hyperlipidemia Continue lovastatin as prescribed.   4. Class 2 drug-induced obesity with serious comorbidity and body mass index (BMI) of 38.0 to 38.9 in adult He is focused on keeping the weight off [place] term. I praised his efforts and we will follow up in  6 months to evaluate his progress again. Continue ozempic as prescribed.    General Counseling: Duong verbalizes understanding of the findings of todays visit and agrees with plan of treatment. I have discussed any further diagnostic evaluation that may be needed or ordered today. We also reviewed his medications today. he has been encouraged to call the office with any questions or concerns that should arise related to todays visit.    Orders Placed This Encounter  Procedures   POCT glycosylated hemoglobin (Hb A1C)    No orders of the defined types were placed in this encounter.   Return in about 6 months (around 05/04/2023) for CPE, Kyani Simkin PCP and a1c in november please. .   Total time spent:30 Minutes Time spent includes review of chart, medications, test results, and follow up plan with the patient.   [place] Controlled Substance Database was reviewed by me.  This patient was seen by Sallyanne Kuster, FNP-C in collaboration with Dr. Beverely Risen as a part of collaborative care agreement.   Brandi Tomlinson R. Tedd Sias, MSN, FNP-C Internal medicine

## 2022-12-01 ENCOUNTER — Other Ambulatory Visit: Payer: Self-pay | Admitting: Nurse Practitioner

## 2022-12-01 DIAGNOSIS — E1165 Type 2 diabetes mellitus with hyperglycemia: Secondary | ICD-10-CM

## 2022-12-28 ENCOUNTER — Other Ambulatory Visit: Payer: Self-pay | Admitting: Nurse Practitioner

## 2022-12-29 ENCOUNTER — Other Ambulatory Visit: Payer: Self-pay

## 2022-12-29 DIAGNOSIS — E1165 Type 2 diabetes mellitus with hyperglycemia: Secondary | ICD-10-CM

## 2022-12-29 MED ORDER — PEN NEEDLES 30G X 5 MM MISC: 1.0000 | Freq: Four times a day (QID) | 5 refills | Status: DC | PRN

## 2023-02-24 ENCOUNTER — Other Ambulatory Visit: Payer: Self-pay | Admitting: Nurse Practitioner

## 2023-02-24 DIAGNOSIS — I10 Essential (primary) hypertension: Secondary | ICD-10-CM

## 2023-03-24 ENCOUNTER — Other Ambulatory Visit: Payer: Self-pay | Admitting: Nurse Practitioner

## 2023-03-24 DIAGNOSIS — E1165 Type 2 diabetes mellitus with hyperglycemia: Secondary | ICD-10-CM

## 2023-04-16 ENCOUNTER — Other Ambulatory Visit: Payer: Self-pay | Admitting: Nurse Practitioner

## 2023-04-16 DIAGNOSIS — E1165 Type 2 diabetes mellitus with hyperglycemia: Secondary | ICD-10-CM

## 2023-05-07 DIAGNOSIS — H2513 Age-related nuclear cataract, bilateral: Secondary | ICD-10-CM | POA: Diagnosis not present

## 2023-05-07 DIAGNOSIS — E133293 Other specified diabetes mellitus with mild nonproliferative diabetic retinopathy without macular edema, bilateral: Secondary | ICD-10-CM | POA: Diagnosis not present

## 2023-05-07 DIAGNOSIS — H524 Presbyopia: Secondary | ICD-10-CM | POA: Diagnosis not present

## 2023-05-07 DIAGNOSIS — H5213 Myopia, bilateral: Secondary | ICD-10-CM | POA: Diagnosis not present

## 2023-05-08 ENCOUNTER — Ambulatory Visit (INDEPENDENT_AMBULATORY_CARE_PROVIDER_SITE_OTHER): Payer: BC Managed Care – PPO | Admitting: Nurse Practitioner

## 2023-05-08 ENCOUNTER — Encounter: Payer: Self-pay | Admitting: Nurse Practitioner

## 2023-05-08 VITALS — BP 138/80 | HR 87 | Temp 97.8°F | Resp 16 | Ht 71.5 in | Wt 283.2 lb

## 2023-05-08 DIAGNOSIS — E66812 Obesity, class 2: Secondary | ICD-10-CM | POA: Diagnosis not present

## 2023-05-08 DIAGNOSIS — E1159 Type 2 diabetes mellitus with other circulatory complications: Secondary | ICD-10-CM | POA: Diagnosis not present

## 2023-05-08 DIAGNOSIS — Z794 Long term (current) use of insulin: Secondary | ICD-10-CM | POA: Diagnosis not present

## 2023-05-08 DIAGNOSIS — Z0001 Encounter for general adult medical examination with abnormal findings: Secondary | ICD-10-CM | POA: Diagnosis not present

## 2023-05-08 DIAGNOSIS — N471 Phimosis: Secondary | ICD-10-CM

## 2023-05-08 DIAGNOSIS — E661 Drug-induced obesity: Secondary | ICD-10-CM

## 2023-05-08 DIAGNOSIS — Z6838 Body mass index (BMI) 38.0-38.9, adult: Secondary | ICD-10-CM

## 2023-05-08 DIAGNOSIS — Z1212 Encounter for screening for malignant neoplasm of rectum: Secondary | ICD-10-CM

## 2023-05-08 DIAGNOSIS — Z1211 Encounter for screening for malignant neoplasm of colon: Secondary | ICD-10-CM | POA: Diagnosis not present

## 2023-05-08 DIAGNOSIS — I152 Hypertension secondary to endocrine disorders: Secondary | ICD-10-CM

## 2023-05-08 NOTE — Progress Notes (Signed)
[facility] [address] [place], [place] [address]  Internal MEDICINE  Office Visit Note  Patient Name: Brett Wiley  [id number]  [id number]  Date of Service: 05/08/2023  Chief Complaint  Patient presents with   Annual Exam   Diabetes   Hypertension   Hyperlipidemia   Quality Metric Gaps    Colonoscopy    HPI Ulice presents for an annual well visit and physical exam.  Well-appearing 67 y.o. male with hypertension, diabetes,  Routine CRC screening: due now, last done in July 2019 Eye exam: went yesterday and exam was good. Goes to [facility]  foot exam: done today  Labs: overdue for routine labs and A1c check New or worsening pain: none  Other concerns: none  Fasting glucose was 94 this morning at home. Reports average sugars have been good.    Current Medication: Outpatient Encounter Medications as of 05/08/2023  Medication Sig   diltiazem (CARDIZEM CD) 240 MG 24 hr capsule Take 1 capsule (240 mg total) by mouth every morning.   FARXIGA 10 MG TABS tablet TAKE ONE TABLET BY MOUTH DAILY BEFORE BREAKFAST   Insulin Pen Needle (PEN NEEDLES) 30G X 5 MM MISC 1 each by Does not apply route 4 (four) times daily as needed. E11.65   lisinopril (ZESTRIL) 2.5 MG tablet TAKE 1 TABLET BY MOUTH DAILY   lovastatin (MEVACOR) 20 MG tablet Take 1 tablet (20 mg total) by mouth at bedtime.   mupirocin ointment (BACTROBAN) 2 % Apply small amount to effected area twice daily for one week.   NOVOLOG FLEXPEN 100 UNIT/ML FlexPen INJECT 2-4 UNITS UNDER THE SKIN THREE TIMES DAILY AS NEEDED WITH MEALS PER SLIDING SCALE. MAX IS 14 UNITS DAILY ONLY IF BLOOD SUGAR IS ABOVE 100.   OneTouch Delica Lancets 30G MISC Use as directed 4 times a daily Dx E11.65   OZEMPIC, 2 MG/DOSE, 8 MG/3ML SOPN DIAL AND INJECT UNDER THE SKIN 2 MG WEEKLY   SEMGLEE, YFGN, 100 UNIT/ML Pen INJECT 50 UNITS INTO THE SKIN DAILY   sildenafil (REVATIO) 20 MG tablet Take 1 tablet (20 mg total) by mouth daily as needed.    No facility-administered encounter medications on file as of 05/08/2023.    Surgical History: Past Surgical History:  Procedure Laterality Date   COLONOSCOPY WITH PROPOFOL N/A 12/18/2017   Procedure: COLONOSCOPY WITH PROPOFOL;  Surgeon: Wyline Mood, MD;  Location[facility] ENDOSCOPY;  Service: Gastroenterology;  Laterality: N/A;   RECTAL EXAM UNDER ANESTHESIA N/A 05/24/2018   Procedure: RECTAL EXAM UNDER ANESTHESIA WITH POSSIBLE POLYPECTOMY;  Surgeon: Ancil Linsey, MD;  Location: [facility] ORS;  Service: General;  Laterality: N/A;    Medical History: Past Medical History:  Diagnosis Date   Arthritis    Atrial fibrillation (HCC) 1993   Diabetes mellitus without complication (HCC)    History of kidney stones    Hypercholesteremia    Hypertension     Family History: Family History  Problem Relation Age of Onset   Diabetes Mother    Diabetes Father    Hypertension Father     Social History   Socioeconomic History   Marital status: Single    Spouse name: Not on file   Number of children: Not on file   Years of education: Not on file   Highest education level: Not on file  Occupational History   Not on file  Tobacco Use   Smoking status: Never   Smokeless tobacco: Never  Vaping Use   Vaping status: Never  Used  Substance and Sexual Activity   Alcohol use: No    Alcohol/week: 0.0 standard drinks of alcohol   Drug use: No   Sexual activity: Not on file  Other Topics Concern   Not on file  Social History Narrative   Not on file   Social Determinants of Health   Financial Resource Strain: Not on file  Food Insecurity: Not on file  Transportation Needs: Not on file  Physical Activity: Not on file  Stress: Not on file  Social Connections: Not on file  Intimate Partner Violence: Not on file      Review of Systems  Constitutional:  Negative for activity change, appetite change, chills, fatigue, fever and unexpected weight change.  HENT: Negative.  Negative for  congestion, ear pain, rhinorrhea, sore throat and trouble swallowing.   Eyes: Negative.   Respiratory: Negative.  Negative for cough, chest tightness, shortness of breath and wheezing.   Cardiovascular: Negative.  Negative for chest pain and palpitations.  Gastrointestinal: Negative.  Negative for abdominal pain, blood in stool, constipation, diarrhea, nausea and vomiting.  Endocrine: Negative.   Genitourinary:  Positive for penile pain (phimosis). Negative for difficulty urinating, dysuria, frequency, hematuria and urgency.  Musculoskeletal: Negative.  Negative for arthralgias, back pain, joint swelling, myalgias and neck pain.  Skin: Negative.  Negative for rash and wound.  Allergic/Immunologic: Negative.  Negative for immunocompromised state.  Neurological: Negative.  Negative for dizziness, seizures, numbness and headaches.  Hematological: Negative.   Psychiatric/Behavioral: Negative.  Negative for behavioral problems, self-injury and suicidal ideas. The patient is not nervous/anxious.     Vital Signs: BP 138/80   Pulse 87   Temp 97.8 F (36.6 C)   Resp 16   Ht 5' 11.5" (1.816 m)   Wt 283 lb 3.2 oz (128.5 kg)   SpO2 96%   BMI 38.95 kg/m    Physical Exam Vitals reviewed.  Constitutional:      General: He is awake. He is not in acute distress.    Appearance: Normal appearance. He is well-developed and well-groomed. He is obese. He is not ill-appearing or diaphoretic.  HENT:     Head: Normocephalic and atraumatic.     Right Ear: Tympanic membrane, ear canal and external ear normal.     Left Ear: Tympanic membrane, ear canal and external ear normal.     Nose: Nose normal. No congestion or rhinorrhea.     Mouth/Throat:     Lips: Pink.     Mouth: Mucous membranes are moist.     Pharynx: Oropharynx is clear. Uvula midline. No oropharyngeal exudate or posterior oropharyngeal erythema.  Eyes:     General: Lids are normal. Vision grossly intact. Gaze aligned appropriately. No  scleral icterus.       Right eye: No discharge.        Left eye: No discharge.     Extraocular Movements: Extraocular movements intact.     Conjunctiva/sclera: Conjunctivae normal.     Pupils: Pupils are equal, round, and reactive to light.     Funduscopic exam:    Right eye: Red reflex present.        Left eye: Red reflex present. Neck:     Thyroid: No thyromegaly.     Vascular: No JVD.     Trachea: Trachea and phonation normal. No tracheal deviation.  Cardiovascular:     Rate and Rhythm: Normal rate and regular rhythm.     Pulses: Normal pulses.  Dorsalis pedis pulses are 2+ on the right side and 2+ on the left side.       Posterior tibial pulses are 2+ on the right side and 2+ on the left side.     Heart sounds: Normal heart sounds, S1 normal and S2 normal. No murmur heard.    No friction rub. No gallop.  Pulmonary:     Effort: Pulmonary effort is normal. No accessory muscle usage or respiratory distress.     Breath sounds: Normal breath sounds and air entry. No stridor. No wheezing or rales.  Chest:     Chest wall: No tenderness.  Abdominal:     General: Bowel sounds are normal. There is no distension.     Palpations: Abdomen is soft. There is no shifting dullness, fluid wave, mass or pulsatile mass.     Tenderness: There is no abdominal tenderness. There is no guarding or rebound.  Genitourinary:    Penis: Uncircumcised. Phimosis present.   Musculoskeletal:        General: No tenderness or deformity. Normal range of motion.     Cervical back: Normal range of motion and neck supple.     Right foot: Normal range of motion. No deformity, bunion, Charcot foot, foot drop or prominent metatarsal heads.     Left foot: Normal range of motion. No deformity, bunion, Charcot foot, foot drop or prominent metatarsal heads.  Feet:     Right foot:     Protective Sensation: 6 sites tested.  6 sites sensed.     Skin integrity: Callus and dry skin present. No ulcer, blister, skin  breakdown, erythema, warmth or fissure.     Toenail Condition: Right toenails are abnormally thick.     Left foot:     Protective Sensation: 6 sites tested.  6 sites sensed.     Skin integrity: Callus and dry skin present. No ulcer, blister, skin breakdown, erythema, warmth or fissure.     Toenail Condition: Left toenails are abnormally thick.  Lymphadenopathy:     Cervical: No cervical adenopathy.  Skin:    General: Skin is warm and dry.     Capillary Refill: Capillary refill takes less than 2 seconds.     Coloration: Skin is not pale.     Findings: No erythema or rash.  Neurological:     Mental Status: He is alert and oriented to person, place, and time.     Cranial Nerves: No cranial nerve deficit.     Motor: No abnormal muscle tone.     Coordination: Coordination normal.     Gait: Gait normal.     Deep Tendon Reflexes: Reflexes are normal and symmetric.  Psychiatric:        Mood and Affect: Mood normal.        Behavior: Behavior normal. Behavior is cooperative.        Thought Content: Thought content normal.        Judgment: Judgment normal.    Diabetic Foot Exam - Simple   Simple Foot Form Diabetic Foot exam was performed with the following findings: Yes 05/08/2023 10:19 AM  Visual Inspection Sensation Testing Pulse Check Comments        Assessment/Plan: 1. Encounter for general adult medical examination with abnormal findings Age-appropriate preventive screenings and vaccinations discussed, annual physical exam completed. Routine labs for health maintenance will be done soon. PHM updated.   2. 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 - Urine Microalbumin w/creat. ratio  3. Hypertension associated with diabetes (HCC) Stable, continue lisinopril and diltiazem  as prescribed.   4. Phimosis of penis Referred to urology - Ambulatory referral to Urology  5. Screening for colorectal cancer Referred to GI - Ambulatory referral to Gastroenterology  6. Class 2 drug-induced obesity with serious comorbidity and body mass index (BMI) of 38.0 to 38.9 in adult Patient continues to gradually lose weight. Continue current medications, diet and lifestyle modifications as discussed.       General Counseling: Jayvon verbalizes understanding of the findings of todays visit and agrees with plan of treatment. I have discussed any further diagnostic evaluation that may be needed or ordered today. We also reviewed his medications today. he has been encouraged to call the office with any questions or concerns that should arise related to todays visit.    Orders Placed This Encounter  Procedures   Urine Microalbumin w/creat. ratio   Ambulatory referral to Gastroenterology   Ambulatory referral to Urology    No orders of the defined types were placed in this encounter.   Return in about 4 months (around 09/05/2023) for F/U, Recheck A1C, Madelin Weseman PCP and will call pt for appt if has abnormal labs. .   Total time spent:30 Minutes Time spent includes review of chart, medications, test results, and follow up plan with the patient.   [place] Controlled Substance Database was reviewed by me.  This patient was seen by Sallyanne Kuster, FNP-C in collaboration with Dr. Beverely Risen as a part of collaborative care agreement.  Etty Isaac R. Tedd Sias, MSN, FNP-C Internal medicine

## 2023-05-10 LAB — MICROALBUMIN / CREATININE URINE RATIO
Creatinine, Urine: 83.1 mg/dL
Microalb/Creat Ratio: 4 mg/g{creat} (ref 0–29)
Microalbumin, Urine: 3.2 ug/mL

## 2023-06-11 ENCOUNTER — Other Ambulatory Visit: Payer: Self-pay | Admitting: Nurse Practitioner

## 2023-06-11 DIAGNOSIS — E782 Mixed hyperlipidemia: Secondary | ICD-10-CM | POA: Diagnosis not present

## 2023-06-11 DIAGNOSIS — Z129 Encounter for screening for malignant neoplasm, site unspecified: Secondary | ICD-10-CM | POA: Diagnosis not present

## 2023-06-11 DIAGNOSIS — E1165 Type 2 diabetes mellitus with hyperglycemia: Secondary | ICD-10-CM | POA: Diagnosis not present

## 2023-06-11 DIAGNOSIS — E538 Deficiency of other specified B group vitamins: Secondary | ICD-10-CM | POA: Diagnosis not present

## 2023-06-11 DIAGNOSIS — D509 Iron deficiency anemia, unspecified: Secondary | ICD-10-CM | POA: Diagnosis not present

## 2023-06-11 DIAGNOSIS — E559 Vitamin D deficiency, unspecified: Secondary | ICD-10-CM | POA: Diagnosis not present

## 2023-06-11 DIAGNOSIS — Z125 Encounter for screening for malignant neoplasm of prostate: Secondary | ICD-10-CM | POA: Diagnosis not present

## 2023-06-12 LAB — B12 AND FOLATE PANEL
Folate: 9.7 ng/mL (ref >3.0)
Vitamin B-12: 359 pg/mL (ref 232–1245)

## 2023-06-12 LAB — COMPREHENSIVE METABOLIC PANEL
ALT: 15 [IU]/L (ref 0–44)
AST: 17 [IU]/L (ref 0–40)
Albumin: 4.2 g/dL (ref 3.9–4.9)
Alkaline Phosphatase: 76 [IU]/L (ref 44–121)
BUN/Creatinine Ratio: 19 (ref 10–24)
BUN: 13 mg/dL (ref 8–27)
Bilirubin Total: 0.3 mg/dL (ref 0.0–1.2)
CO2: 23 mmol/L (ref 20–29)
Calcium: 9.2 mg/dL (ref 8.6–10.2)
Chloride: 104 mmol/L (ref 96–106)
Creatinine, Ser: 0.7 mg/dL — ABNORMAL LOW (ref 0.76–1.27)
Globulin, Total: 2.9 g/dL (ref 1.5–4.5)
Glucose: 112 mg/dL — ABNORMAL HIGH (ref 70–99)
Potassium: 4.4 mmol/L (ref 3.5–5.2)
Sodium: 140 mmol/L (ref 134–144)
Total Protein: 7.1 g/dL (ref 6.0–8.5)
eGFR: 102 mL/min/{1.73_m2} (ref >59)

## 2023-06-12 LAB — HGB A1C W/O EAG: Hgb A1c MFr Bld: 6.5 % — ABNORMAL HIGH (ref 4.8–5.6)

## 2023-06-12 LAB — CBC WITH DIFFERENTIAL/PLATELET
Basophils Absolute: 0.1 10*3/uL (ref 0.0–0.2)
Basos: 1 %
EOS (ABSOLUTE): 0.1 10*3/uL (ref 0.0–0.4)
Eos: 2 %
Hematocrit: 47.3 % (ref 37.5–51.0)
Hemoglobin: 15.4 g/dL (ref 13.0–17.7)
Immature Grans (Abs): 0 10*3/uL (ref 0.0–0.1)
Immature Granulocytes: 1 %
Lymphocytes Absolute: 1.9 10*3/uL (ref 0.7–3.1)
Lymphs: 33 %
MCH: 29 pg (ref 26.6–33.0)
MCHC: 32.6 g/dL (ref 31.5–35.7)
MCV: 89 fL (ref 79–97)
Monocytes Absolute: 0.5 10*3/uL (ref 0.1–0.9)
Monocytes: 8 %
Neutrophils Absolute: 3.3 10*3/uL (ref 1.4–7.0)
Neutrophils: 55 %
Platelets: 249 10*3/uL (ref 150–450)
RBC: 5.31 x10E6/uL (ref 4.14–5.80)
RDW: 13.2 % (ref 11.6–15.4)
WBC: 6 10*3/uL (ref 3.4–10.8)

## 2023-06-12 LAB — LIPID PANEL WITH LDL/HDL RATIO
Cholesterol, Total: 170 mg/dL (ref 100–199)
HDL: 42 mg/dL (ref >39)
LDL Chol Calc (NIH): 115 mg/dL — ABNORMAL HIGH (ref 0–99)
LDL/HDL Ratio: 2.7 {ratio} (ref 0.0–3.6)
Triglycerides: 69 mg/dL (ref 0–149)
VLDL Cholesterol Cal: 13 mg/dL (ref 5–40)

## 2023-06-12 LAB — VITAMIN D 25 HYDROXY (VIT D DEFICIENCY, FRACTURES): Vit D, 25-Hydroxy: 12.2 ng/mL — ABNORMAL LOW (ref 30.0–100.0)

## 2023-06-12 LAB — PSA: Prostate Specific Ag, Serum: 7.2 ng/mL — ABNORMAL HIGH (ref 0.0–4.0)

## 2023-06-22 ENCOUNTER — Ambulatory Visit: Payer: BC Managed Care – PPO | Admitting: Urology

## 2023-06-22 VITALS — BP 118/77 | HR 88 | Ht 71.0 in | Wt 279.0 lb

## 2023-06-22 DIAGNOSIS — R972 Elevated prostate specific antigen [PSA]: Secondary | ICD-10-CM

## 2023-06-22 DIAGNOSIS — N471 Phimosis: Secondary | ICD-10-CM

## 2023-06-22 MED ORDER — CLOBETASOL PROP EMOLLIENT BASE 0.05 % EX CREA: 1.0000 | TOPICAL_CREAM | Freq: Two times a day (BID) | CUTANEOUS | 0 refills | Status: AC

## 2023-06-22 NOTE — Progress Notes (Signed)
 ISABRA Channing LITTIE Ramonita, acting as a scribe for Glendia JAYSON Barba, MD., have documented all relevant documentation on the behalf of Glendia JAYSON Barba, MD, as directed by Glendia JAYSON Barba, MD while in the presence of Glendia JAYSON Barba, MD.  06/22/2023 5:45 PM   Harman LITTIE Law [birth year] [phone]  Referring provider: Liana Fish, NP [address] [place],  [place] [address]  Chief Complaint  Patient presents with   Other    HPI: Brett Wiley is a 68 y.o. male referred for evaluation of phimosis.   1 year history of progressive difficulty retracting his foreskin. Currently unable to retract his foreskin.  No history of balanitis, + diabetes. Notes some changes in his urinary stream, secondary to phimosis.  No dysuria or gross hematuria PSA 7.2    PMH: Past Medical History:  Diagnosis Date   Arthritis    Atrial fibrillation (HCC) 1993   Diabetes mellitus without complication (HCC)    History of kidney stones    Hypercholesteremia    Hypertension     Surgical History: Past Surgical History:  Procedure Laterality Date   COLONOSCOPY WITH PROPOFOL  N/A 12/18/2017   Procedure: COLONOSCOPY WITH PROPOFOL ;  Surgeon: Therisa Bi, MD;  Location: [facility] ENDOSCOPY;  Service: Gastroenterology;  Laterality: N/A;   RECTAL EXAM UNDER ANESTHESIA N/A 05/24/2018   Procedure: RECTAL EXAM UNDER ANESTHESIA WITH POSSIBLE POLYPECTOMY;  Surgeon: Nicholaus Selinda Birmingham, MD;  Location: [facility] ORS;  Service: General;  Laterality: N/A;    Home Medications:  Allergies as of 06/22/2023   No Known Allergies      Medication List        Accurate as of June 22, 2023  5:45 PM. If you have any questions, ask your nurse or doctor.          Clobetasol  Prop Emollient Base 0.05 % emollient cream Apply 1 Application topically 2 (two) times daily.   diltiazem  240 MG 24 hr capsule Commonly known as: CARDIZEM  CD Take 1 capsule (240 mg total) by mouth every morning.   Farxiga  10 MG Tabs tablet Generic drug:  dapagliflozin  propanediol TAKE ONE TABLET BY MOUTH DAILY BEFORE BREAKFAST   lisinopril  2.5 MG tablet Commonly known as: ZESTRIL  TAKE 1 TABLET BY MOUTH DAILY   lovastatin  20 MG tablet Commonly known as: MEVACOR  Take 1 tablet (20 mg total) by mouth at bedtime.   mupirocin  ointment 2 % Commonly known as: BACTROBAN  Apply small amount to effected area twice daily for one week.   NovoLOG  FlexPen 100 UNIT/ML FlexPen Generic drug: insulin  aspart INJECT 2-4 UNITS UNDER THE SKIN THREE TIMES DAILY AS NEEDED WITH MEALS PER SLIDING SCALE. MAX IS 14 UNITS DAILY ONLY IF BLOOD SUGAR IS ABOVE 100.   OneTouch Delica Lancets 30G Misc Use as directed 4 times a daily Dx E11.65   Ozempic  (2 MG/DOSE) 8 MG/3ML Sopn Generic drug: Semaglutide  (2 MG/DOSE) DIAL AND INJECT UNDER THE SKIN 2 MG WEEKLY   Pen Needles 30G X 5 MM Misc 1 each by Does not apply route 4 (four) times daily as needed. E11.65   Semglee  (yfgn) 100 UNIT/ML Pen Generic drug: insulin  glargine-yfgn INJECT 50 UNITS INTO THE SKIN DAILY   sildenafil  20 MG tablet Commonly known as: REVATIO  Take 1 tablet (20 mg total) by mouth daily as needed.        Allergies: No Known Allergies  Family History: Family History  Problem Relation Age of Onset   Diabetes Mother    Diabetes Father  Hypertension Father     Social History:  reports that he has never smoked. He has never used smokeless tobacco. He reports that he does not drink alcohol and does not use drugs.   Physical Exam: BP 118/77   Pulse 88   Ht 5' 11 (1.803 m)   Wt 279 lb (126.6 kg)   BMI 38.91 kg/m   Constitutional:  Alert and oriented, No acute distress. HEENT: [place] AT Respiratory: Normal respiratory effort, no increased work of breathing. GU: Phallus uncircumcised. Thickened fibrotic distal prepuce. Unable to retract prepuce. Testes descended bilaterally.  Psychiatric: Normal mood and affect.   Assessment & Plan:    1. 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  We discussed options of trial of topical steroid, circumcision and dorsal slit. He would initially like to try a topical steroid Rx sent to pharmacy.  1 month follow-up for recheck   2. Elevated PSA Noted after patient left on chart review that a PSA drawn 06/11/2023 was 7.2. DRE on follow-up with a repeat PSA.  I have reviewed the above documentation for accuracy and completeness, and I agree with the above.   Glendia JAYSON Barba, MD  [facility] [address] [place], [place] [address] [phone]

## 2023-06-27 ENCOUNTER — Encounter: Payer: Self-pay | Admitting: Urology

## 2023-07-18 ENCOUNTER — Other Ambulatory Visit: Payer: Self-pay | Admitting: Nurse Practitioner

## 2023-07-18 DIAGNOSIS — Z794 Long term (current) use of insulin: Secondary | ICD-10-CM

## 2023-07-25 ENCOUNTER — Encounter: Payer: Self-pay | Admitting: Urology

## 2023-07-25 ENCOUNTER — Ambulatory Visit: Payer: Self-pay | Admitting: Urology

## 2023-08-22 ENCOUNTER — Other Ambulatory Visit: Payer: Self-pay | Admitting: Nurse Practitioner

## 2023-08-22 DIAGNOSIS — I10 Essential (primary) hypertension: Secondary | ICD-10-CM

## 2023-08-22 DIAGNOSIS — E782 Mixed hyperlipidemia: Secondary | ICD-10-CM

## 2023-08-22 DIAGNOSIS — E1165 Type 2 diabetes mellitus with hyperglycemia: Secondary | ICD-10-CM

## 2023-09-04 ENCOUNTER — Encounter: Payer: Self-pay | Admitting: Nurse Practitioner

## 2023-09-04 ENCOUNTER — Ambulatory Visit (INDEPENDENT_AMBULATORY_CARE_PROVIDER_SITE_OTHER): Payer: BC Managed Care – PPO | Admitting: Nurse Practitioner

## 2023-09-04 VITALS — BP 126/78 | HR 86 | Temp 98.2°F | Resp 16 | Ht 71.0 in | Wt 291.0 lb

## 2023-09-04 DIAGNOSIS — R972 Elevated prostate specific antigen [PSA]: Secondary | ICD-10-CM | POA: Diagnosis not present

## 2023-09-04 DIAGNOSIS — Z794 Long term (current) use of insulin: Secondary | ICD-10-CM

## 2023-09-04 DIAGNOSIS — E559 Vitamin D deficiency, unspecified: Secondary | ICD-10-CM

## 2023-09-04 DIAGNOSIS — Z6838 Body mass index (BMI) 38.0-38.9, adult: Secondary | ICD-10-CM

## 2023-09-04 DIAGNOSIS — I152 Hypertension secondary to endocrine disorders: Secondary | ICD-10-CM

## 2023-09-04 DIAGNOSIS — E1169 Type 2 diabetes mellitus with other specified complication: Secondary | ICD-10-CM | POA: Diagnosis not present

## 2023-09-04 DIAGNOSIS — E661 Drug-induced obesity: Secondary | ICD-10-CM

## 2023-09-04 DIAGNOSIS — E119 Type 2 diabetes mellitus without complications: Secondary | ICD-10-CM | POA: Insufficient documentation

## 2023-09-04 DIAGNOSIS — N528 Other male erectile dysfunction: Secondary | ICD-10-CM

## 2023-09-04 DIAGNOSIS — E1159 Type 2 diabetes mellitus with other circulatory complications: Secondary | ICD-10-CM | POA: Diagnosis not present

## 2023-09-04 DIAGNOSIS — E66812 Obesity, class 2: Secondary | ICD-10-CM

## 2023-09-04 DIAGNOSIS — E785 Hyperlipidemia, unspecified: Secondary | ICD-10-CM

## 2023-09-04 MED ORDER — VITAMIN D (ERGOCALCIFEROL) 1.25 MG (50000 UNIT) PO CAPS: 50000.0000 [IU] | ORAL_CAPSULE | ORAL | 1 refills | Status: DC

## 2023-09-04 MED ORDER — SILDENAFIL CITRATE 20 MG PO TABS: 20.0000 mg | ORAL_TABLET | Freq: Every day | ORAL | 0 refills | Status: DC | PRN

## 2023-09-04 MED ORDER — INSULIN GLARGINE-YFGN 100 UNIT/ML ~~LOC~~ SOPN: 40.0000 [IU] | PEN_INJECTOR | Freq: Every day | SUBCUTANEOUS | Status: DC

## 2023-09-04 NOTE — Progress Notes (Signed)
 [facility] [address] [place], [place] [address]  Internal MEDICINE  Office Visit Note  Patient Name: Brett Wiley  [id number]  [id number]  Date of Service: 09/04/2023  Chief Complaint  Patient presents with   Diabetes   Hypertension   Hyperlipidemia   Follow-up    HPI Brett Wiley presents for a follow-up visit for lab results, diabetes and refills. Diabetes -- A1c was 6.5 in late December. Too soon to check in office today. Semglee insulin dose is 40 units daily now. Still taking farxiga and ozempic as well.  Elevated PSA at 7.2, no previous psa level to compare.  Gained about 12 lbs, but moving around more now that the weather is warmer, plans to increase physical activity.  Low vitamin D at 12.2, not taking any supplement.  Slightly elevated LDL    Current Medication: Outpatient Encounter Medications as of 09/04/2023  Medication Sig   Clobetasol Prop Emollient Base 0.05 % emollient cream Apply 1 Application topically 2 (two) times daily.   diltiazem (CARDIZEM CD) 240 MG 24 hr capsule TAKE 1 CAPSULE BY MOUTH EVERY MORNING   FARXIGA 10 MG TABS tablet TAKE 1 TABLET BY MOUTH DAILY BEFORE BREAKFAST   Insulin Pen Needle (PEN NEEDLES) 30G X 5 MM MISC 1 each by Does not apply route 4 (four) times daily as needed. E11.65   lisinopril (ZESTRIL) 2.5 MG tablet TAKE 1 TABLET BY MOUTH DAILY   lovastatin (MEVACOR) 20 MG tablet TAKE 1 TABLET BY MOUTH AT BEDTIME   mupirocin ointment (BACTROBAN) 2 % Apply small amount to effected area twice daily for one week.   NOVOLOG FLEXPEN 100 UNIT/ML FlexPen INJECT 2-4 UNITS UNDER THE SKIN THREE TIMES DAILY AS NEEDED WITH MEALS PER SLIDING SCALE. MAX IS 14 UNITS DAILY ONLY IF BLOOD SUGAR IS ABOVE 100.   OneTouch Delica Lancets 30G MISC Use as directed 4 times a daily Dx E11.65   OZEMPIC, 2 MG/DOSE, 8 MG/3ML SOPN DIAL AND INJECT UNDER THE SKIN 2 MG WEEKLY   Vitamin D, Ergocalciferol, (DRISDOL) 1.25 MG (50000 UNIT) CAPS capsule Take 1  capsule (50,000 Units total) by mouth every 7 (seven) days.   [DISCONTINUED] SEMGLEE, YFGN, 100 UNIT/ML Pen INJECT 50 UNITS INTO THE SKIN DAILY   [DISCONTINUED] sildenafil (REVATIO) 20 MG tablet Take 1 tablet (20 mg total) by mouth daily as needed.   insulin glargine-yfgn (SEMGLEE, YFGN,) 100 UNIT/ML Pen Inject 40 Units into the skin daily.   sildenafil (REVATIO) 20 MG tablet Take 1 tablet (20 mg total) by mouth daily as needed.   No facility-administered encounter medications on file as of 09/04/2023.    Surgical History: Past Surgical History:  Procedure Laterality Date   COLONOSCOPY WITH PROPOFOL N/A 12/18/2017   Procedure: COLONOSCOPY WITH PROPOFOL;  Surgeon: Wyline Mood, MD;  Location: [facility] ENDOSCOPY;  Service: Gastroenterology;  Laterality: N/A;   RECTAL EXAM UNDER ANESTHESIA N/A 05/24/2018   Procedure: RECTAL EXAM UNDER ANESTHESIA WITH POSSIBLE POLYPECTOMY;  Surgeon: Ancil Linsey, MD;  Location: [facility] ORS;  Service: General;  Laterality: N/A;    Medical History: Past Medical History:  Diagnosis Date   Arthritis    Atrial fibrillation (HCC) 1993   Diabetes mellitus without complication (HCC)    History of kidney stones    Hypercholesteremia    Hypertension     Family History: Family History  Problem Relation Age of Onset   Diabetes Mother    Diabetes Father    Hypertension Father     Social History  Socioeconomic History   Marital status: Single    Spouse name: Not on file   Number of children: Not on file   Years of education: Not on file   Highest education level: Not on file  Occupational History   Not on file  Tobacco Use   Smoking status: Never   Smokeless tobacco: Never  Vaping Use   Vaping status: Never Used  Substance and Sexual Activity   Alcohol use: No    Alcohol/week: 0.0 standard drinks of alcohol   Drug use: No   Sexual activity: Not on file  Other Topics Concern   Not on file  Social History Narrative   Not on file   Social Drivers  of Health   Financial Resource Strain: Not on file  Food Insecurity: Not on file  Transportation Needs: Not on file  Physical Activity: Not on file  Stress: Not on file  Social Connections: Not on file  Intimate Partner Violence: Not on file      Review of Systems  Constitutional:  Negative for chills, fatigue and unexpected weight change.  HENT:  Negative for congestion, rhinorrhea, sneezing and sore throat.   Eyes:  Negative for redness.  Respiratory:  Negative for cough, chest tightness and shortness of breath.   Cardiovascular:  Negative for chest pain and palpitations.  Gastrointestinal:  Negative for abdominal pain, constipation, diarrhea, nausea and vomiting.  Genitourinary:  Negative for dysuria and frequency.  Musculoskeletal:  Negative for arthralgias, back pain, joint swelling and neck pain.  Skin:  Negative for rash.  Neurological: Negative.  Negative for tremors and numbness.  Hematological:  Negative for adenopathy. Does not bruise/bleed easily.  Psychiatric/Behavioral:  Negative for behavioral problems (Depression), sleep disturbance and suicidal ideas. The patient is not nervous/anxious.     Vital Signs: BP 126/78   Pulse 86   Temp 98.2 F (36.8 C)   Resp 16   Ht 5\' 11"  (1.803 m)   Wt 291 lb (132 kg)   SpO2 93%   BMI 40.59 kg/m    Physical Exam Vitals reviewed.  Constitutional:      General: He is not in acute distress.    Appearance: Normal appearance. He is morbidly obese. He is not ill-appearing.  HENT:     Head: Normocephalic and atraumatic.  Eyes:     Pupils: Pupils are equal, round, and reactive to light.  Cardiovascular:     Rate and Rhythm: Normal rate and regular rhythm.  Pulmonary:     Effort: Pulmonary effort is normal. No respiratory distress.  Neurological:     Mental Status: He is alert and oriented to person, place, and time.  Psychiatric:        Mood and Affect: Mood normal.        Behavior: Behavior normal.         Assessment/Plan: 1. 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 - Hgb A1C w/o eAG - insulin glargine-yfgn (SEMGLEE, YFGN,) 100 UNIT/ML Pen; Inject 40 Units into the skin daily.  2. Elevated PSA, less than 10 ng/ml Repeat PSA level, if still elevated, will refer to urology.  - PSA Total (Reflex To Free)  3. Hypertension associated with diabetes (HCC) Continue lisinopril and diltiazem as prescribed.   4. Hyperlipidemia associated with type 2 diabetes mellitus (HCC) Continue lovastatin as prescribed.   5. Vitamin  D deficiency Start weekly vitamin D supplement, repeat vitamin D level in 6 months  - Vitamin D, Ergocalciferol, (DRISDOL) 1.25 MG (50000 UNIT) CAPS capsule; Take 1 capsule (50,000 Units total) by mouth every 7 (seven) days.  Dispense: 12 capsule; Refill: 1  6. Class 2 drug-induced obesity with serious comorbidity and body mass index (BMI) of 38.0 to 38.9 in adult Gained weight since last visit. Increasing physical activity now that it is warmer outside   7. Other male erectile dysfunction Continue sildenafil as prescribed  - sildenafil (REVATIO) 20 MG tablet; Take 1 tablet (20 mg total) by mouth daily as needed.  Dispense: 30 tablet; Refill: 0   General Counseling: Brett Wiley verbalizes understanding of the findings of todays visit and agrees with plan of treatment. I have discussed any further diagnostic evaluation that may be needed or ordered today. We also reviewed his medications today. he has been encouraged to call the office with any questions or concerns that should arise related to todays visit.    Orders Placed This Encounter  Procedures   PSA Total (Reflex To Free)   Hgb A1C w/o eAG    Meds ordered this encounter  Medications   Vitamin D, Ergocalciferol, (DRISDOL) 1.25 MG (50000 UNIT) CAPS  capsule    Sig: Take 1 capsule (50,000 Units total) by mouth every 7 (seven) days.    Dispense:  12 capsule    Refill:  1   insulin glargine-yfgn (SEMGLEE, YFGN,) 100 UNIT/ML Pen    Sig: Inject 40 Units into the skin daily.   sildenafil (REVATIO) 20 MG tablet    Sig: Take 1 tablet (20 mg total) by mouth daily as needed.    Dispense:  30 tablet    Refill:  0    Return in about 4 months (around 01/04/2024) for F/U, Recheck A1C, Darline Faith PCP.   Total time spent:30 Minutes Time spent includes review of chart, medications, test results, and follow up plan with the patient.   [place] Controlled Substance Database was reviewed by me.  This patient was seen by Sallyanne Kuster, FNP-C in collaboration with Dr. Beverely Risen as a part of collaborative care agreement.   Tyrick Dunagan R. Tedd Sias, MSN, FNP-C Internal medicine

## 2023-09-05 ENCOUNTER — Encounter: Payer: Self-pay | Admitting: Nurse Practitioner

## 2023-09-07 DIAGNOSIS — E1159 Type 2 diabetes mellitus with other circulatory complications: Secondary | ICD-10-CM | POA: Diagnosis not present

## 2023-09-07 DIAGNOSIS — R972 Elevated prostate specific antigen [PSA]: Secondary | ICD-10-CM | POA: Diagnosis not present

## 2023-09-07 DIAGNOSIS — Z794 Long term (current) use of insulin: Secondary | ICD-10-CM | POA: Diagnosis not present

## 2023-09-08 LAB — FPSA% REFLEX
% FREE PSA: 5.1 %
PSA, FREE: 0.35 ng/mL

## 2023-09-08 LAB — PSA TOTAL (REFLEX TO FREE): Prostate Specific Ag, Serum: 6.8 ng/mL — ABNORMAL HIGH (ref 0.0–4.0)

## 2023-09-08 LAB — HGB A1C W/O EAG: Hgb A1c MFr Bld: 6.5 % — ABNORMAL HIGH (ref 4.8–5.6)

## 2023-09-09 ENCOUNTER — Other Ambulatory Visit: Payer: Self-pay | Admitting: Nurse Practitioner

## 2023-09-09 DIAGNOSIS — I10 Essential (primary) hypertension: Secondary | ICD-10-CM

## 2023-10-06 ENCOUNTER — Other Ambulatory Visit: Payer: Self-pay | Admitting: Nurse Practitioner

## 2023-10-06 DIAGNOSIS — N528 Other male erectile dysfunction: Secondary | ICD-10-CM

## 2023-10-19 ENCOUNTER — Other Ambulatory Visit: Payer: Self-pay | Admitting: Nurse Practitioner

## 2023-10-19 DIAGNOSIS — Z794 Long term (current) use of insulin: Secondary | ICD-10-CM

## 2023-10-19 MED ORDER — INSULIN GLARGINE-YFGN 100 UNIT/ML ~~LOC~~ SOPN: 40.0000 [IU] | PEN_INJECTOR | Freq: Every day | SUBCUTANEOUS | 3 refills | Status: DC

## 2023-10-19 NOTE — Telephone Encounter (Signed)
 Please review and send last times says its print

## 2023-10-29 DIAGNOSIS — H2513 Age-related nuclear cataract, bilateral: Secondary | ICD-10-CM | POA: Diagnosis not present

## 2023-10-29 DIAGNOSIS — H524 Presbyopia: Secondary | ICD-10-CM | POA: Diagnosis not present

## 2023-10-29 DIAGNOSIS — H5213 Myopia, bilateral: Secondary | ICD-10-CM | POA: Diagnosis not present

## 2023-10-29 DIAGNOSIS — E133293 Other specified diabetes mellitus with mild nonproliferative diabetic retinopathy without macular edema, bilateral: Secondary | ICD-10-CM | POA: Diagnosis not present

## 2023-11-03 ENCOUNTER — Other Ambulatory Visit: Payer: Self-pay | Admitting: Nurse Practitioner

## 2023-11-10 ENCOUNTER — Other Ambulatory Visit: Payer: Self-pay | Admitting: Nurse Practitioner

## 2023-11-10 DIAGNOSIS — E1165 Type 2 diabetes mellitus with hyperglycemia: Secondary | ICD-10-CM

## 2024-01-07 ENCOUNTER — Encounter: Payer: Self-pay | Admitting: Nurse Practitioner

## 2024-01-07 ENCOUNTER — Ambulatory Visit (INDEPENDENT_AMBULATORY_CARE_PROVIDER_SITE_OTHER): Payer: Self-pay | Admitting: Nurse Practitioner

## 2024-01-07 VITALS — BP 111/65 | HR 80 | Temp 98.0°F | Resp 16 | Ht 71.0 in | Wt 287.0 lb

## 2024-01-07 DIAGNOSIS — L299 Pruritus, unspecified: Secondary | ICD-10-CM | POA: Diagnosis not present

## 2024-01-07 DIAGNOSIS — E538 Deficiency of other specified B group vitamins: Secondary | ICD-10-CM

## 2024-01-07 DIAGNOSIS — E1159 Type 2 diabetes mellitus with other circulatory complications: Secondary | ICD-10-CM | POA: Diagnosis not present

## 2024-01-07 DIAGNOSIS — L853 Xerosis cutis: Secondary | ICD-10-CM

## 2024-01-07 DIAGNOSIS — E1169 Type 2 diabetes mellitus with other specified complication: Secondary | ICD-10-CM | POA: Diagnosis not present

## 2024-01-07 DIAGNOSIS — Z794 Long term (current) use of insulin: Secondary | ICD-10-CM

## 2024-01-07 DIAGNOSIS — I152 Hypertension secondary to endocrine disorders: Secondary | ICD-10-CM

## 2024-01-07 DIAGNOSIS — Z1212 Encounter for screening for malignant neoplasm of rectum: Secondary | ICD-10-CM

## 2024-01-07 DIAGNOSIS — Z125 Encounter for screening for malignant neoplasm of prostate: Secondary | ICD-10-CM

## 2024-01-07 DIAGNOSIS — E785 Hyperlipidemia, unspecified: Secondary | ICD-10-CM

## 2024-01-07 DIAGNOSIS — E559 Vitamin D deficiency, unspecified: Secondary | ICD-10-CM

## 2024-01-07 DIAGNOSIS — Z1211 Encounter for screening for malignant neoplasm of colon: Secondary | ICD-10-CM

## 2024-01-07 LAB — POCT GLYCOSYLATED HEMOGLOBIN (HGB A1C): Hemoglobin A1C: 6.2 % — AB (ref 4.0–5.6)

## 2024-01-07 MED ORDER — PEN NEEDLES 30G X 5 MM MISC: 1.0000 | Freq: Four times a day (QID) | 5 refills | Status: AC | PRN

## 2024-01-07 MED ORDER — INSULIN GLARGINE-YFGN 100 UNIT/ML ~~LOC~~ SOPN
40.0000 [IU] | PEN_INJECTOR | Freq: Every day | SUBCUTANEOUS | 3 refills | Status: AC
Start: 2024-01-07 — End: ?

## 2024-01-07 MED ORDER — NOVOLOG FLEXPEN 100 UNIT/ML ~~LOC~~ SOPN
PEN_INJECTOR | SUBCUTANEOUS | 5 refills | Status: AC
Start: 2024-01-07 — End: ?

## 2024-01-07 MED ORDER — OZEMPIC (2 MG/DOSE) 8 MG/3ML ~~LOC~~ SOPN: 2.0000 mg | PEN_INJECTOR | SUBCUTANEOUS | 3 refills | Status: DC

## 2024-01-07 MED ORDER — LISINOPRIL 2.5 MG PO TABS: 2.5000 mg | ORAL_TABLET | Freq: Every day | ORAL | 1 refills | Status: DC

## 2024-01-07 NOTE — Progress Notes (Signed)
 [facility] [address] [place], [place] [address]  Internal MEDICINE  Office Visit Note  Patient Name: Brett Wiley  [id number]  [id number]  Date of Service: 01/07/2024  Chief Complaint  Patient presents with   Diabetes   Hypertension   Hyperlipidemia   Medication Refill    Novolog    Quality Metric Gaps    Colonoscopy    HPI Anup presents for a follow-up visit for diabetes, high cholesterol, hypertension, dry itchy skin and medication refills.  Dry itchy skin x1 month, wants to see dermatology Diabetes -- A1c is improved to 6.2 which is stable. Currently taking farxiga , semglee  insulin , novolog  insulin  and ozempic .  High cholesterol -- taking lovastatin  daily Hypertension -- controlled with diltiazem  and lisinopril .     Current Medication: Outpatient Encounter Medications as of 01/07/2024  Medication Sig   Clobetasol  Prop Emollient Base 0.05 % emollient cream Apply 1 Application topically 2 (two) times daily.   diltiazem  (CARDIZEM  CD) 240 MG 24 hr capsule TAKE 1 CAPSULE BY MOUTH EVERY MORNING   FARXIGA  10 MG TABS tablet TAKE 1 TABLET BY MOUTH DAILY BEFORE BREAKFAST   lovastatin  (MEVACOR ) 20 MG tablet TAKE 1 TABLET BY MOUTH AT BEDTIME   mupirocin  ointment (BACTROBAN ) 2 % Apply small amount to effected area twice daily for one week.   OneTouch Delica Lancets 30G MISC Use as directed 4 times a daily Dx E11.65   sildenafil  (REVATIO ) 20 MG tablet TAKE 1 TABLET BY MOUTH DAILY AS NEEDED   Vitamin D , Ergocalciferol , (DRISDOL ) 1.25 MG (50000 UNIT) CAPS capsule Take 1 capsule (50,000 Units total) by mouth every 7 (seven) days.   [DISCONTINUED] insulin  glargine-yfgn (SEMGLEE , YFGN,) 100 UNIT/ML Pen Inject 40 Units into the skin daily.   [DISCONTINUED] Insulin  Pen Needle (PEN NEEDLES) 30G X 5 MM MISC 1 each by Does not apply route 4 (four) times daily as needed. E11.65   [DISCONTINUED] lisinopril  (ZESTRIL ) 2.5 MG tablet TAKE 1 TABLET BY MOUTH DAILY   [DISCONTINUED]  NOVOLOG  FLEXPEN 100 UNIT/ML FlexPen INJECT 2-4 UNITS UNDER THE SKIN THREE TIMES A DAY AS NEEDED WITH MEALS PER SLIDING SCALE .MAX IS 14 UNITS DAILY ONLY IF BLOOD SUGAR IS ABOVE 100   [DISCONTINUED] OZEMPIC , 2 MG/DOSE, 8 MG/3ML SOPN DIAL AND INJECT UNDER THE SKIN 2 MG WEEKLY   insulin  glargine-yfgn (SEMGLEE , YFGN,) 100 UNIT/ML Pen Inject 40 Units into the skin daily.   Insulin  Pen Needle (PEN NEEDLES) 30G X 5 MM MISC 1 each by Does not apply route 4 (four) times daily as needed. E11.65   lisinopril  (ZESTRIL ) 2.5 MG tablet Take 1 tablet (2.5 mg total) by mouth daily.   NOVOLOG  FLEXPEN 100 UNIT/ML FlexPen INJECT 2-4 UNITS UNDER THE SKIN THREE TIMES A DAY AS NEEDED WITH MEALS PER SLIDING SCALE .MAX IS 14 UNITS DAILY ONLY IF BLOOD SUGAR IS ABOVE 100   Semaglutide , 2 MG/DOSE, (OZEMPIC , 2 MG/DOSE,) 8 MG/3ML SOPN Inject 2 mg into the skin once a week.   No facility-administered encounter medications on file as of 01/07/2024.    Surgical History: Past Surgical History:  Procedure Laterality Date   COLONOSCOPY WITH PROPOFOL  N/A 12/18/2017   Procedure: COLONOSCOPY WITH PROPOFOL ;  Surgeon: Therisa Bi, MD;  Location: [facility] ENDOSCOPY;  Service: Gastroenterology;  Laterality: N/A;   RECTAL EXAM UNDER ANESTHESIA N/A 05/24/2018   Procedure: RECTAL EXAM UNDER ANESTHESIA WITH POSSIBLE POLYPECTOMY;  Surgeon: Nicholaus Selinda Birmingham, MD;  Location: [facility] ORS;  Service: General;  Laterality: N/A;    Medical History: Past  Medical History:  Diagnosis Date   Arthritis    Atrial fibrillation (HCC) 1993   Diabetes mellitus without complication (HCC)    History of kidney stones    Hypercholesteremia    Hypertension     Family History: Family History  Problem Relation Age of Onset   Diabetes Mother    Diabetes Father    Hypertension Father     Social History   Socioeconomic History   Marital status: Single    Spouse name: Not on file   Number of children: Not on file   Years of education: Not on file    Highest education level: Not on file  Occupational History   Not on file  Tobacco Use   Smoking status: Never   Smokeless tobacco: Never  Vaping Use   Vaping status: Never Used  Substance and Sexual Activity   Alcohol use: No    Alcohol/week: 0.0 standard drinks of alcohol   Drug use: No   Sexual activity: Not on file  Other Topics Concern   Not on file  Social History Narrative   Not on file   Social Drivers of Health   Financial Resource Strain: Not on file  Food Insecurity: Not on file  Transportation Needs: Not on file  Physical Activity: Not on file  Stress: Not on file  Social Connections: Not on file  Intimate Partner Violence: Not on file      Review of Systems  Constitutional:  Negative for chills, fatigue and unexpected weight change.  HENT:  Negative for congestion, rhinorrhea, sneezing and sore throat.   Eyes:  Negative for redness.  Respiratory:  Negative for cough, chest tightness and shortness of breath.   Cardiovascular:  Negative for chest pain and palpitations.  Gastrointestinal:  Negative for abdominal pain, constipation, diarrhea, nausea and vomiting.  Genitourinary:  Negative for dysuria and frequency.  Musculoskeletal:  Negative for arthralgias, back pain, joint swelling and neck pain.  Skin:  Negative for rash.  Neurological: Negative.  Negative for tremors and numbness.  Hematological:  Negative for adenopathy. Does not bruise/bleed easily.  Psychiatric/Behavioral:  Negative for behavioral problems (Depression), sleep disturbance and suicidal ideas. The patient is not nervous/anxious.     Vital Signs: BP 111/65   Pulse 80   Temp 98 F (36.7 C)   Resp 16   Ht 5' 11 (1.803 m)   Wt 287 lb (130.2 kg)   SpO2 96%   BMI 40.03 kg/m    Physical Exam Vitals reviewed.  Constitutional:      General: He is not in acute distress.    Appearance: Normal appearance. He is morbidly obese. He is not ill-appearing.  HENT:     Head: Normocephalic  and atraumatic.  Eyes:     Pupils: Pupils are equal, round, and reactive to light.  Cardiovascular:     Rate and Rhythm: Normal rate and regular rhythm.  Pulmonary:     Effort: Pulmonary effort is normal. No respiratory distress.  Neurological:     Mental Status: He is alert and oriented to person, place, and time.  Psychiatric:        Mood and Affect: Mood normal.        Behavior: Behavior normal.        Assessment/Plan: 1. Type 2 diabetes mellitus with other circulatory complication, with [place]-term current use of insulin  (HCC) (Primary) A1c is elevated today, continue medications as prescribed. Routine labs ordered  - POCT HgB A1C - NOVOLOG  FLEXPEN 100 UNIT/ML  FlexPen; INJECT 2-4 UNITS UNDER THE SKIN THREE TIMES A DAY AS NEEDED WITH MEALS PER SLIDING SCALE .MAX IS 14 UNITS DAILY ONLY IF BLOOD SUGAR IS ABOVE 100  Dispense: 6 mL; Refill: 5 - Insulin  Pen Needle (PEN NEEDLES) 30G X 5 MM MISC; 1 each by Does not apply route 4 (four) times daily as needed. E11.65  Dispense: 100 each; Refill: 5 - insulin  glargine-yfgn (SEMGLEE , YFGN,) 100 UNIT/ML Pen; Inject 40 Units into the skin daily.  Dispense: 15 mL; Refill: 3 - Semaglutide , 2 MG/DOSE, (OZEMPIC , 2 MG/DOSE,) 8 MG/3ML SOPN; Inject 2 mg into the skin once a week.  Dispense: 3 mL; Refill: 3 - CBC with Differential/Platelet - CMP14+EGFR - Hgb A1C w/o eAG - B12 and Folate Panel - Vitamin D  (25 hydroxy) - Lipid Profile - PSA Total (Reflex To Free)  2. Hypertension associated with type 2 diabetes mellitus (HCC) Continue lisinopril  as prescribed. Routine labs ordered  - lisinopril  (ZESTRIL ) 2.5 MG tablet; Take 1 tablet (2.5 mg total) by mouth daily.  Dispense: 90 tablet; Refill: 1 - CBC with Differential/Platelet - CMP14+EGFR - Hgb A1C w/o eAG - B12 and Folate Panel - Vitamin D  (25 hydroxy) - Lipid Profile - PSA Total (Reflex To Free)  3. Hyperlipidemia associated with type 2 diabetes mellitus (HCC) Routine labs ordered. Continue  lovastatin  as prescribed.  - CBC with Differential/Platelet - CMP14+EGFR - Hgb A1C w/o eAG - B12 and Folate Panel - Vitamin D  (25 hydroxy) - Lipid Profile - PSA Total (Reflex To Free)  4. Xerosis of skin Referred to dermatology  - Ambulatory referral to Dermatology  5. Itchy skin Referred to dermatology  - Ambulatory referral to Dermatology  6. B12 deficiency Routine labs ordered  - CBC with Differential/Platelet - CMP14+EGFR - Hgb A1C w/o eAG - B12 and Folate Panel - Vitamin D  (25 hydroxy) - Lipid Profile - PSA Total (Reflex To Free)  7. Vitamin D  deficiency Routine labs ordered  - CBC with Differential/Platelet - CMP14+EGFR - Hgb A1C w/o eAG - B12 and Folate Panel - Vitamin D  (25 hydroxy) - Lipid Profile - PSA Total (Reflex To Free)  8. Screening for colorectal cancer Referred to GI - Ambulatory referral to Gastroenterology  9. Screening for prostate cancer Routine lab ordered  - PSA Total (Reflex To Free)   General Counseling: Maliki verbalizes understanding of the findings of todays visit and agrees with plan of treatment. I have discussed any further diagnostic evaluation that may be needed or ordered today. We also reviewed his medications today. he has been encouraged to call the office with any questions or concerns that should arise related to todays visit.    Orders Placed This Encounter  Procedures   CBC with Differential/Platelet   CMP14+EGFR   Hgb A1C w/o eAG   B12 and Folate Panel   Vitamin D  (25 hydroxy)   Lipid Profile   PSA Total (Reflex To Free)   Ambulatory referral to Gastroenterology   Ambulatory referral to Dermatology   POCT HgB A1C    Meds ordered this encounter  Medications   NOVOLOG  FLEXPEN 100 UNIT/ML FlexPen    Sig: INJECT 2-4 UNITS UNDER THE SKIN THREE TIMES A DAY AS NEEDED WITH MEALS PER SLIDING SCALE .MAX IS 14 UNITS DAILY ONLY IF BLOOD SUGAR IS ABOVE 100    Dispense:  6 mL    Refill:  5    Fill new script today,  patient is out of his medication. Dx code E11.65   lisinopril  (ZESTRIL )  2.5 MG tablet    Sig: Take 1 tablet (2.5 mg total) by mouth daily.    Dispense:  90 tablet    Refill:  1   Insulin  Pen Needle (PEN NEEDLES) 30G X 5 MM MISC    Sig: 1 each by Does not apply route 4 (four) times daily as needed. E11.65    Dispense:  100 each    Refill:  5   insulin  glargine-yfgn (SEMGLEE , YFGN,) 100 UNIT/ML Pen    Sig: Inject 40 Units into the skin daily.    Dispense:  15 mL    Refill:  3    For future refills, note change in dose. Dx code E11.65   Semaglutide , 2 MG/DOSE, (OZEMPIC , 2 MG/DOSE,) 8 MG/3ML SOPN    Sig: Inject 2 mg into the skin once a week.    Dispense:  3 mL    Refill:  3    Return for previously scheduled, CPE, Yoona Ishii PCP in december..   Total time spent:30 Minutes Time spent includes review of chart, medications, test results, and follow up plan with the patient.   [place] Controlled Substance Database was reviewed by me.  This patient was seen by Mardy Maxin, FNP-C in collaboration with Dr. Sigrid Bathe as a part of collaborative care agreement.   Clayson Riling R. Maxin, MSN, FNP-C Internal medicine

## 2024-01-08 ENCOUNTER — Telehealth: Payer: Self-pay | Admitting: Nurse Practitioner

## 2024-01-08 NOTE — Telephone Encounter (Signed)
 Gastroenterology referral sent via [employer] to Dr. Therisa w/ [facility]. Notified patient. Gave telephone # [phone] &   Dermatology referral sent via [employer] to [facility]. Notified patient. Gave telephone # [phone]

## 2024-02-06 ENCOUNTER — Telehealth: Payer: Self-pay

## 2024-02-06 NOTE — Telephone Encounter (Signed)
 Spoke to patient to let him know his Ozempic  was approved but he is thinking it will be still too expensive for him so he will be letting us  know.

## 2024-02-15 ENCOUNTER — Other Ambulatory Visit: Payer: Self-pay | Admitting: Nurse Practitioner

## 2024-02-15 DIAGNOSIS — E559 Vitamin D deficiency, unspecified: Secondary | ICD-10-CM

## 2024-02-19 DIAGNOSIS — H2513 Age-related nuclear cataract, bilateral: Secondary | ICD-10-CM | POA: Diagnosis not present

## 2024-02-19 DIAGNOSIS — H524 Presbyopia: Secondary | ICD-10-CM | POA: Diagnosis not present

## 2024-02-19 DIAGNOSIS — E133293 Other specified diabetes mellitus with mild nonproliferative diabetic retinopathy without macular edema, bilateral: Secondary | ICD-10-CM | POA: Diagnosis not present

## 2024-02-19 DIAGNOSIS — H5213 Myopia, bilateral: Secondary | ICD-10-CM | POA: Diagnosis not present

## 2024-02-26 ENCOUNTER — Telehealth: Payer: Self-pay | Admitting: Nurse Practitioner

## 2024-02-26 NOTE — Telephone Encounter (Signed)
 Gastroenterology appointment 07/25/2024 with [facility]

## 2024-03-10 ENCOUNTER — Other Ambulatory Visit: Payer: Self-pay | Admitting: Nurse Practitioner

## 2024-03-10 DIAGNOSIS — E1159 Type 2 diabetes mellitus with other circulatory complications: Secondary | ICD-10-CM

## 2024-05-05 DIAGNOSIS — E113293 Type 2 diabetes mellitus with mild nonproliferative diabetic retinopathy without macular edema, bilateral: Secondary | ICD-10-CM | POA: Diagnosis not present

## 2024-05-05 DIAGNOSIS — H2513 Age-related nuclear cataract, bilateral: Secondary | ICD-10-CM | POA: Diagnosis not present

## 2024-05-05 DIAGNOSIS — H524 Presbyopia: Secondary | ICD-10-CM | POA: Diagnosis not present

## 2024-05-05 DIAGNOSIS — H5213 Myopia, bilateral: Secondary | ICD-10-CM | POA: Diagnosis not present

## 2024-05-05 LAB — HM DIABETES EYE EXAM

## 2024-05-12 ENCOUNTER — Ambulatory Visit (INDEPENDENT_AMBULATORY_CARE_PROVIDER_SITE_OTHER): Payer: BC Managed Care – PPO | Admitting: Nurse Practitioner

## 2024-05-12 ENCOUNTER — Telehealth: Payer: Self-pay | Admitting: Nurse Practitioner

## 2024-05-12 ENCOUNTER — Encounter: Payer: Self-pay | Admitting: Nurse Practitioner

## 2024-05-12 VITALS — BP 130/72 | HR 84 | Temp 96.2°F | Resp 16 | Ht 71.0 in | Wt 293.6 lb

## 2024-05-12 DIAGNOSIS — G8929 Other chronic pain: Secondary | ICD-10-CM | POA: Insufficient documentation

## 2024-05-12 DIAGNOSIS — Z0001 Encounter for general adult medical examination with abnormal findings: Secondary | ICD-10-CM

## 2024-05-12 DIAGNOSIS — E785 Hyperlipidemia, unspecified: Secondary | ICD-10-CM

## 2024-05-12 DIAGNOSIS — Z125 Encounter for screening for malignant neoplasm of prostate: Secondary | ICD-10-CM

## 2024-05-12 DIAGNOSIS — E559 Vitamin D deficiency, unspecified: Secondary | ICD-10-CM | POA: Diagnosis not present

## 2024-05-12 DIAGNOSIS — I152 Hypertension secondary to endocrine disorders: Secondary | ICD-10-CM

## 2024-05-12 DIAGNOSIS — E1169 Type 2 diabetes mellitus with other specified complication: Secondary | ICD-10-CM | POA: Diagnosis not present

## 2024-05-12 DIAGNOSIS — Z6841 Body Mass Index (BMI) 40.0 and over, adult: Secondary | ICD-10-CM | POA: Insufficient documentation

## 2024-05-12 DIAGNOSIS — Z1212 Encounter for screening for malignant neoplasm of rectum: Secondary | ICD-10-CM

## 2024-05-12 DIAGNOSIS — M25561 Pain in right knee: Secondary | ICD-10-CM | POA: Diagnosis not present

## 2024-05-12 DIAGNOSIS — Z794 Long term (current) use of insulin: Secondary | ICD-10-CM

## 2024-05-12 DIAGNOSIS — E1159 Type 2 diabetes mellitus with other circulatory complications: Secondary | ICD-10-CM

## 2024-05-12 DIAGNOSIS — Z1211 Encounter for screening for malignant neoplasm of colon: Secondary | ICD-10-CM

## 2024-05-12 NOTE — Telephone Encounter (Signed)
 Urgent Orthopedic referral sent via [employer] to [facility]. Notified patient. Gave patient telephone # [phone].  Gastroenterology referral sent via [employer] to [facility]. Notified patient. Gave patient telephone # [phone]

## 2024-05-12 NOTE — Progress Notes (Signed)
 [facility] [address] [place], [place] [address]  Internal MEDICINE  Office Visit Note  Patient Name: Brett Wiley  [id number]  [id number]  Date of Service: 05/12/2024  Chief Complaint  Patient presents with   Diabetes   Hypertension   Hyperlipidemia   Annual Exam    HPI Brett Wiley presents for his initial medicare annual wellness visit..  Well-appearing 68 y.o. male with hypertension, high cholesterol, diabetes, chronic right knee pain, and ED.  Routine CRC screening: due for routine colonoscopy now. Last done in 2019 Eye exam: done recently   foot exam: done  Labs: due for routine labs  New or worsening pain: right knee pain  Other concerns: Episode of dizziness and vertigo -- x2 episodes that were brief.  Right knee pain -- for 6 months or more, sometimes gives out when he puts weight on. Ready to be referred to orthopedic surgery.  Trouble with losing weight even while on ozempic  2 mg weekly. Consider switching to mounjaro or checking for hypercortisolism.     Current Medication: Outpatient Encounter Medications as of 05/12/2024  Medication Sig   Clobetasol  Prop Emollient Base 0.05 % emollient cream Apply 1 Application topically 2 (two) times daily.   diltiazem  (CARDIZEM  CD) 240 MG 24 hr capsule TAKE 1 CAPSULE BY MOUTH EVERY MORNING   FARXIGA  10 MG TABS tablet TAKE 1 TABLET BY MOUTH DAILY BEFORE BREAKFAST   insulin  glargine-yfgn (SEMGLEE , YFGN,) 100 UNIT/ML Pen Inject 40 Units into the skin daily.   Insulin  Pen Needle (PEN NEEDLES) 30G X 5 MM MISC 1 each by Does not apply route 4 (four) times daily as needed. E11.65   lisinopril  (ZESTRIL ) 2.5 MG tablet TAKE 1 TABLET BY MOUTH DAILY   lovastatin  (MEVACOR ) 20 MG tablet TAKE 1 TABLET BY MOUTH AT BEDTIME   mupirocin  ointment (BACTROBAN ) 2 % Apply small amount to effected area twice daily for one week.   NOVOLOG  FLEXPEN 100 UNIT/ML FlexPen INJECT 2-4 UNITS UNDER THE SKIN THREE TIMES A DAY AS NEEDED WITH MEALS  PER SLIDING SCALE .MAX IS 14 UNITS DAILY ONLY IF BLOOD SUGAR IS ABOVE 100   OneTouch Delica Lancets 30G MISC Use as directed 4 times a daily Dx E11.65   Semaglutide , 2 MG/DOSE, (OZEMPIC , 2 MG/DOSE,) 8 MG/3ML SOPN Inject 2 mg into the skin once a week.   sildenafil  (REVATIO ) 20 MG tablet TAKE 1 TABLET BY MOUTH DAILY AS NEEDED   Vitamin D , Ergocalciferol , (DRISDOL ) 1.25 MG (50000 UNIT) CAPS capsule TAKE 1 CAPSULE BY MOUTH EVERY 7 DAYS   No facility-administered encounter medications on file as of 05/12/2024.    Surgical History: Past Surgical History:  Procedure Laterality Date   COLONOSCOPY WITH PROPOFOL  N/A 12/18/2017   Procedure: COLONOSCOPY WITH PROPOFOL ;  Surgeon: Therisa Bi, MD;  Location: [facility] ENDOSCOPY;  Service: Gastroenterology;  Laterality: N/A;   RECTAL EXAM UNDER ANESTHESIA N/A 05/24/2018   Procedure: RECTAL EXAM UNDER ANESTHESIA WITH POSSIBLE POLYPECTOMY;  Surgeon: Nicholaus Selinda Birmingham, MD;  Location: [facility] ORS;  Service: General;  Laterality: N/A;    Medical History: Past Medical History:  Diagnosis Date   Arthritis    Atrial fibrillation (HCC) 1993   Diabetes mellitus without complication (HCC)    History of kidney stones    Hypercholesteremia    Hypertension     Family History: Family History  Problem Relation Age of Onset   Diabetes Mother    Diabetes Father    Hypertension Father     Social History  Socioeconomic History   Marital status: Single    Spouse name: Not on file   Number of children: Not on file   Years of education: Not on file   Highest education level: Not on file  Occupational History   Not on file  Tobacco Use   Smoking status: Never   Smokeless tobacco: Never  Vaping Use   Vaping status: Never Used  Substance and Sexual Activity   Alcohol use: No    Alcohol/week: 0.0 standard drinks of alcohol   Drug use: No   Sexual activity: Not on file  Other Topics Concern   Not on file  Social History Narrative   Not on file   Social  Drivers of Health   Financial Resource Strain: Not on file  Food Insecurity: Not on file  Transportation Needs: Not on file  Physical Activity: Not on file  Stress: Not on file  Social Connections: Not on file  Intimate Partner Violence: Not on file      Review of Systems  Constitutional:  Negative for activity change, appetite change, chills, fatigue, fever and unexpected weight change.  HENT: Negative.  Negative for congestion, ear pain, rhinorrhea, sore throat and trouble swallowing.   Eyes: Negative.   Respiratory: Negative.  Negative for cough, chest tightness, shortness of breath and wheezing.   Cardiovascular: Negative.  Negative for chest pain and palpitations.  Gastrointestinal: Negative.  Negative for abdominal pain, blood in stool, constipation, diarrhea, nausea and vomiting.  Endocrine: Negative.   Genitourinary:  Negative for difficulty urinating, dysuria, frequency, hematuria, penile pain and urgency.  Musculoskeletal: Negative.  Negative for arthralgias, back pain, joint swelling, myalgias and neck pain.  Skin: Negative.  Negative for rash and wound.  Allergic/Immunologic: Negative.  Negative for immunocompromised state.  Neurological: Negative.  Negative for dizziness, seizures, numbness and headaches.  Hematological: Negative.   Psychiatric/Behavioral: Negative.  Negative for behavioral problems, self-injury and suicidal ideas. The patient is not nervous/anxious.     Vital Signs: BP 130/72   Pulse 84   Temp (!) 96.2 F (35.7 C)   Resp 16   Ht 5' 11 (1.803 m)   Wt 293 lb 9.6 oz (133.2 kg)   SpO2 96%   BMI 40.95 kg/m    Physical Exam Vitals reviewed.  Constitutional:      General: He is awake. He is not in acute distress.    Appearance: Normal appearance. He is well-developed and well-groomed. He is obese. He is not ill-appearing or diaphoretic.  HENT:     Head: Normocephalic and atraumatic.     Right Ear: Tympanic membrane, ear canal and external ear  normal.     Left Ear: Tympanic membrane, ear canal and external ear normal.     Nose: Nose normal. No congestion or rhinorrhea.     Mouth/Throat:     Lips: Pink.     Mouth: Mucous membranes are moist.     Pharynx: Oropharynx is clear. Uvula midline. No oropharyngeal exudate or posterior oropharyngeal erythema.  Eyes:     General: Lids are normal. Vision grossly intact. Gaze aligned appropriately. No scleral icterus.       Right eye: No discharge.        Left eye: No discharge.     Extraocular Movements: Extraocular movements intact.     Conjunctiva/sclera: Conjunctivae normal.     Pupils: Pupils are equal, round, and reactive to light.     Funduscopic exam:    Right eye: Red reflex present.  Left eye: Red reflex present. Neck:     Thyroid: No thyromegaly.     Vascular: No JVD.     Trachea: Trachea and phonation normal. No tracheal deviation.  Cardiovascular:     Rate and Rhythm: Normal rate and regular rhythm.     Pulses: Normal pulses.          Dorsalis pedis pulses are 2+ on the right side and 2+ on the left side.       Posterior tibial pulses are 2+ on the right side and 2+ on the left side.     Heart sounds: Normal heart sounds, S1 normal and S2 normal. No murmur heard.    No friction rub. No gallop.  Pulmonary:     Effort: Pulmonary effort is normal. No accessory muscle usage or respiratory distress.     Breath sounds: Normal breath sounds and air entry. No stridor. No wheezing or rales.  Chest:     Chest wall: No tenderness.  Abdominal:     General: Bowel sounds are normal. There is no distension.     Palpations: Abdomen is soft. There is no shifting dullness, fluid wave, mass or pulsatile mass.     Tenderness: There is no abdominal tenderness. There is no guarding or rebound.  Genitourinary:    Penis: Uncircumcised.   Musculoskeletal:        General: No tenderness or deformity. Normal range of motion.     Cervical back: Normal range of motion and neck supple.      Right foot: Normal range of motion. No deformity, bunion, Charcot foot, foot drop or prominent metatarsal heads.     Left foot: Normal range of motion. No deformity, bunion, Charcot foot, foot drop or prominent metatarsal heads.  Feet:     Right foot:     Protective Sensation: 6 sites tested.  6 sites sensed.     Skin integrity: Callus and dry skin present. No ulcer, blister, skin breakdown, erythema, warmth or fissure.     Toenail Condition: Right toenails are abnormally thick.     Left foot:     Protective Sensation: 6 sites tested.  6 sites sensed.     Skin integrity: Callus and dry skin present. No ulcer, blister, skin breakdown, erythema, warmth or fissure.     Toenail Condition: Left toenails are abnormally thick.  Lymphadenopathy:     Cervical: No cervical adenopathy.  Skin:    General: Skin is warm and dry.     Capillary Refill: Capillary refill takes less than 2 seconds.     Coloration: Skin is not pale.     Findings: No erythema or rash.  Neurological:     Mental Status: He is alert and oriented to person, place, and time.     Cranial Nerves: No cranial nerve deficit.     Motor: No abnormal muscle tone.     Coordination: Coordination normal.     Gait: Gait normal.     Deep Tendon Reflexes: Reflexes are normal and symmetric.  Psychiatric:        Mood and Affect: Mood normal.        Behavior: Behavior normal. Behavior is cooperative.        Thought Content: Thought content normal.        Judgment: Judgment normal.        Assessment/Plan: 1. Encounter for Medicare annual examination with abnormal findings (Primary) Age-appropriate preventive screenings and vaccinations discussed. Routine labs for health maintenance ordered, see below. PHM  updated.   - CBC with Differential/Platelet - CMP14+EGFR - Lipid Profile - Hgb A1C w/o eAG - Urine Microalbumin w/creat. ratio - PSA Total (Reflex To Free) - Vitamin D  (25 hydroxy)  2. Type 2 diabetes mellitus with other  circulatory complication, with Brett Wiley-term current use of insulin  (HCC) Routine labs ordered. Continue farxiga  and ozempic  as prescribed.  - CBC with Differential/Platelet - CMP14+EGFR - Lipid Profile - Hgb A1C w/o eAG - Urine Microalbumin w/creat. ratio  3. Hypertension associated with diabetes (HCC) Routine labs ordered  - CBC with Differential/Platelet - CMP14+EGFR - Lipid Profile - Hgb A1C w/o eAG - Urine Microalbumin w/creat. ratio  4. Hyperlipidemia associated with type 2 diabetes mellitus (HCC) Routine labs ordered  - CBC with Differential/Platelet - CMP14+EGFR - Lipid Profile - Hgb A1C w/o eAG - Urine Microalbumin w/creat. ratio  5. Chronic pain of right knee Referred to orthopedic surgery for further evaluation  - Ambulatory referral to Orthopedic Surgery  6. Vitamin D  deficiency Routine lab ordered  - Vitamin D  (25 hydroxy)  7. Body mass index (BMI) 40.0-44.9, adult (HCC) Noted, patient has had issues with losing weight on ozempic . Will consider switching his medication to mounjaro at his next office visit.   8. Screening for colorectal cancer Ref[facility] to Dr[facility]isa at [facility]  - Ambulatory referral to Gastroenterology  9. Screening for prostate cancer Routine lab ordered  - PSA Total (Reflex To Free)     General Counseling: Brett Wiley verbalizes understanding of the findings of todays visit and agrees with plan of treatment. I have discussed any further diagnostic evaluation that may be needed or ordered today. We also reviewed his medications today. he has been encouraged to call the office with any questions or concerns that should arise related to todays visit.    Orders Placed This Encounter  Procedures   CBC with Differential/Platelet   CMP14+EGFR   Lipid Profile   Hgb A1C w/o eAG   Urine Microalbumin w/creat. ratio   PSA Total (Reflex To Free)   Vitamin D  (25 hydroxy)   Ambulatory referral to Orthopedic Surgery   Ambulatory referral to  Gastroenterology    No orders of the defined types were placed in this encounter.   Return in about 6 weeks (around 06/23/2024) for F/U, Labs, Brett Wiley PCP.   Total time spent:30 Minutes Time spent includes review of chart, medications, test results, and follow up plan with the patient.    Controlled Substance Database was reviewed by me.  This patient was seen by Mardy Maxin, FNP-C in collaboration with Dr. Sigrid Bathe as a part of collaborative care agreement.  Brett Wiley R. Maxin, MSN, FNP-C Internal medicine

## 2024-05-13 LAB — MICROALBUMIN / CREATININE URINE RATIO
Creatinine, Urine: 170.6 mg/dL
Microalb/Creat Ratio: 4 mg/g{creat} (ref 0–29)
Microalbumin, Urine: 6.7 ug/mL

## 2024-05-14 ENCOUNTER — Telehealth: Payer: Self-pay | Admitting: Nurse Practitioner

## 2024-05-14 NOTE — Telephone Encounter (Signed)
 GI appointment 07/25/2024 with [facility]

## 2024-05-20 ENCOUNTER — Telehealth: Payer: Self-pay | Admitting: Nurse Practitioner

## 2024-05-20 NOTE — Telephone Encounter (Signed)
 Per Niels Gaba, Orthopedic referral has been closed due to patient not returning calls -Brett Wiley

## 2024-06-27 ENCOUNTER — Encounter: Payer: Self-pay | Admitting: Nurse Practitioner

## 2024-06-27 ENCOUNTER — Encounter: Admitting: Nurse Practitioner

## 2024-06-27 NOTE — Progress Notes (Signed)
Rescheduled apointment

## 2024-07-04 ENCOUNTER — Other Ambulatory Visit: Payer: Self-pay | Admitting: Nurse Practitioner

## 2024-07-04 DIAGNOSIS — E1159 Type 2 diabetes mellitus with other circulatory complications: Secondary | ICD-10-CM

## 2024-07-10 ENCOUNTER — Telehealth: Payer: Self-pay

## 2024-07-10 NOTE — Telephone Encounter (Signed)
 Patient notified of the Ozempic  is approved.

## 2024-07-12 LAB — CMP14+EGFR
ALT: 24 [IU]/L (ref 0–44)
AST: 22 [IU]/L (ref 0–40)
Albumin: 4.2 g/dL (ref 3.9–4.9)
Alkaline Phosphatase: 66 [IU]/L (ref 47–123)
BUN/Creatinine Ratio: 14 (ref 10–24)
BUN: 11 mg/dL (ref 8–27)
Bilirubin Total: 0.4 mg/dL (ref 0.0–1.2)
CO2: 22 mmol/L (ref 20–29)
Calcium: 9 mg/dL (ref 8.6–10.2)
Chloride: 107 mmol/L — ABNORMAL HIGH (ref 96–106)
Creatinine, Ser: 0.79 mg/dL (ref 0.76–1.27)
Globulin, Total: 2.9 g/dL (ref 1.5–4.5)
Glucose: 120 mg/dL — ABNORMAL HIGH (ref 70–99)
Potassium: 4.5 mmol/L (ref 3.5–5.2)
Sodium: 142 mmol/L (ref 134–144)
Total Protein: 7.1 g/dL (ref 6.0–8.5)
eGFR: 98 mL/min/{1.73_m2}

## 2024-07-12 LAB — CBC WITH DIFFERENTIAL/PLATELET
Basophils Absolute: 0.1 10*3/uL (ref 0.0–0.2)
Basos: 1 %
EOS (ABSOLUTE): 0.1 10*3/uL (ref 0.0–0.4)
Eos: 2 %
Hematocrit: 45.3 % (ref 37.5–51.0)
Hemoglobin: 14.7 g/dL (ref 13.0–17.7)
Immature Grans (Abs): 0.1 10*3/uL (ref 0.0–0.1)
Immature Granulocytes: 2 %
Lymphocytes Absolute: 2.3 10*3/uL (ref 0.7–3.1)
Lymphs: 47 %
MCH: 28.5 pg (ref 26.6–33.0)
MCHC: 32.5 g/dL (ref 31.5–35.7)
MCV: 88 fL (ref 79–97)
Monocytes Absolute: 0.4 10*3/uL (ref 0.1–0.9)
Monocytes: 8 %
Neutrophils Absolute: 2 10*3/uL (ref 1.4–7.0)
Neutrophils: 40 %
Platelets: 271 10*3/uL (ref 150–450)
RBC: 5.15 x10E6/uL (ref 4.14–5.80)
RDW: 13.5 % (ref 11.6–15.4)
WBC: 5 10*3/uL (ref 3.4–10.8)

## 2024-07-12 LAB — VITAMIN D 25 HYDROXY (VIT D DEFICIENCY, FRACTURES): Vit D, 25-Hydroxy: 26.8 ng/mL — ABNORMAL LOW (ref 30.0–100.0)

## 2024-07-12 LAB — LIPID PANEL
Chol/HDL Ratio: 6.8 ratio — ABNORMAL HIGH (ref 0.0–5.0)
Cholesterol, Total: 170 mg/dL (ref 100–199)
HDL: 25 mg/dL — ABNORMAL LOW
LDL Chol Calc (NIH): 123 mg/dL — ABNORMAL HIGH (ref 0–99)
Triglycerides: 121 mg/dL (ref 0–149)
VLDL Cholesterol Cal: 22 mg/dL (ref 5–40)

## 2024-07-12 LAB — PSA TOTAL (REFLEX TO FREE): Prostate Specific Ag, Serum: 8.2 ng/mL — ABNORMAL HIGH (ref 0.0–4.0)

## 2024-07-12 LAB — B12 AND FOLATE PANEL
Folate: 9.5 ng/mL
Vitamin B-12: 460 pg/mL (ref 232–1245)

## 2024-07-12 LAB — FPSA% REFLEX
% FREE PSA: 3.5 %
PSA, FREE: 0.29 ng/mL

## 2024-07-12 LAB — HGB A1C W/O EAG: Hgb A1c MFr Bld: 7.2 % — ABNORMAL HIGH (ref 4.8–5.6)

## 2024-07-14 ENCOUNTER — Ambulatory Visit: Payer: Self-pay | Admitting: Nurse Practitioner

## 2024-07-14 NOTE — Progress Notes (Signed)
 The lab results were reviewed by the provider and will be discussed in detail with the patient at his upcoming visit this week.

## 2024-07-17 ENCOUNTER — Encounter: Payer: Self-pay | Admitting: Nurse Practitioner

## 2024-07-17 ENCOUNTER — Ambulatory Visit (INDEPENDENT_AMBULATORY_CARE_PROVIDER_SITE_OTHER): Admitting: Nurse Practitioner

## 2024-07-17 VITALS — BP 120/74 | HR 80 | Temp 96.2°F | Resp 16 | Ht 71.0 in | Wt 286.2 lb

## 2024-07-17 DIAGNOSIS — Z794 Long term (current) use of insulin: Secondary | ICD-10-CM

## 2024-07-17 DIAGNOSIS — R972 Elevated prostate specific antigen [PSA]: Secondary | ICD-10-CM | POA: Diagnosis not present

## 2024-07-17 DIAGNOSIS — I152 Hypertension secondary to endocrine disorders: Secondary | ICD-10-CM | POA: Diagnosis not present

## 2024-07-17 DIAGNOSIS — E1159 Type 2 diabetes mellitus with other circulatory complications: Secondary | ICD-10-CM | POA: Diagnosis not present

## 2024-07-17 DIAGNOSIS — E785 Hyperlipidemia, unspecified: Secondary | ICD-10-CM | POA: Diagnosis not present

## 2024-07-17 DIAGNOSIS — E1169 Type 2 diabetes mellitus with other specified complication: Secondary | ICD-10-CM

## 2024-07-17 DIAGNOSIS — E559 Vitamin D deficiency, unspecified: Secondary | ICD-10-CM | POA: Diagnosis not present

## 2024-07-17 NOTE — Progress Notes (Cosign Needed)
 [facility] [address] [place], [place] [address]  Internal MEDICINE  Office Visit Note  Patient Name: Brett Wiley  [id number]  [id number]  Date of Service: 07/17/2024  Chief Complaint  Patient presents with   Diabetes   Hypertension   Hyperlipidemia   Follow-up    Review labs    HPI Ethanael presents for a follow-up visit for lab results.  Diabetes -- A1c is 7.2. patient has been a little lax on his diet and states he will work on this and get the number to come down before next check.  LDL 123 with elevated chol/HDL ratio but the rest of the cholesterol panel is normal Vitamin D  level is improving but still slightly low Elevated PSA 8.2. last year it was elevated at 7.2. when it was rechecked it dropped to 6.8. Now his PSA is 8.2 with %freePSA of 3.5 on reflex. He was seen by Dr. Twylla with urology last year.    Current Medication: Outpatient Encounter Medications as of 07/17/2024  Medication Sig   Clobetasol  Prop Emollient Base 0.05 % emollient cream Apply 1 Application topically 2 (two) times daily.   diltiazem  (CARDIZEM  CD) 240 MG 24 hr capsule TAKE 1 CAPSULE BY MOUTH EVERY MORNING   FARXIGA  10 MG TABS tablet TAKE 1 TABLET BY MOUTH DAILY BEFORE BREAKFAST   insulin  glargine-yfgn (SEMGLEE , YFGN,) 100 UNIT/ML Pen Inject 40 Units into the skin daily.   Insulin  Pen Needle (PEN NEEDLES) 30G X 5 MM MISC 1 each by Does not apply route 4 (four) times daily as needed. E11.65   lisinopril  (ZESTRIL ) 2.5 MG tablet TAKE 1 TABLET BY MOUTH DAILY   lovastatin  (MEVACOR ) 20 MG tablet TAKE 1 TABLET BY MOUTH AT BEDTIME   mupirocin  ointment (BACTROBAN ) 2 % Apply small amount to effected area twice daily for one week. (Patient not taking: Reported on 06/27/2024)   NOVOLOG  FLEXPEN 100 UNIT/ML FlexPen INJECT 2-4 UNITS UNDER THE SKIN THREE TIMES A DAY AS NEEDED WITH MEALS PER SLIDING SCALE .MAX IS 14 UNITS DAILY ONLY IF BLOOD SUGAR IS ABOVE 100   OneTouch Delica Lancets 30G MISC Use  as directed 4 times a daily Dx E11.65   OZEMPIC , 2 MG/DOSE, 8 MG/3ML SOPN DIAL AND INJECT UNDER THE SKIN 2 MG WEEKLY   sildenafil  (REVATIO ) 20 MG tablet TAKE 1 TABLET BY MOUTH DAILY AS NEEDED   Vitamin D , Ergocalciferol , (DRISDOL ) 1.25 MG (50000 UNIT) CAPS capsule TAKE 1 CAPSULE BY MOUTH EVERY 7 DAYS   No facility-administered encounter medications on file as of 07/17/2024.    Surgical History: Past Surgical History:  Procedure Laterality Date   COLONOSCOPY WITH PROPOFOL  N/A 12/18/2017   Procedure: COLONOSCOPY WITH PROPOFOL ;  Surgeon: Therisa Bi, MD;  Location: [facility] ENDOSCOPY;  Service: Gastroenterology;  Laterality: N/A;   RECTAL EXAM UNDER ANESTHESIA N/A 05/24/2018   Procedure: RECTAL EXAM UNDER ANESTHESIA WITH POSSIBLE POLYPECTOMY;  Surgeon: Nicholaus Selinda Birmingham, MD;  Location: [facility] ORS;  Service: General;  Laterality: N/A;    Medical History: Past Medical History:  Diagnosis Date   Arthritis    Atrial fibrillation (HCC) 1993   Diabetes mellitus without complication (HCC)    History of kidney stones    Hypercholesteremia    Hypertension     Family History: Family History  Problem Relation Age of Onset   Diabetes Mother    Diabetes Father    Hypertension Father     Social History   Socioeconomic History   Marital status: Single  Spouse name: Not on file   Number of children: Not on file   Years of education: Not on file   Highest education level: Not on file  Occupational History   Not on file  Tobacco Use   Smoking status: Never   Smokeless tobacco: Never  Vaping Use   Vaping status: Never Used  Substance and Sexual Activity   Alcohol use: No    Alcohol/week: 0.0 standard drinks of alcohol   Drug use: No   Sexual activity: Not on file  Other Topics Concern   Not on file  Social History Narrative   Not on file   Social Drivers of Health   Tobacco Use: Low Risk (07/17/2024)   Patient History    Smoking Tobacco Use: Never    Smokeless Tobacco Use: Never     Passive Exposure: Not on file  Financial Resource Strain: Not on file  Food Insecurity: Not on file  Transportation Needs: Not on file  Physical Activity: Not on file  Stress: Not on file  Social Connections: Not on file  Intimate Partner Violence: Not on file  Depression (PHQ2-9): Low Risk (05/12/2024)   Depression (PHQ2-9)    PHQ-2 Score: 0  Alcohol Screen: Low Risk (01/07/2024)   Alcohol Screen    Last Alcohol Screening Score (AUDIT): 1  Housing: Not on file  Utilities: Not on file  Health Literacy: Not on file      Review of Systems  Constitutional:  Negative for chills, fatigue and unexpected weight change.  HENT:  Negative for congestion, rhinorrhea, sneezing and sore throat.   Eyes:  Negative for redness.  Respiratory:  Negative for cough, chest tightness and shortness of breath.   Cardiovascular:  Negative for chest pain and palpitations.  Gastrointestinal:  Negative for abdominal pain, constipation, diarrhea, nausea and vomiting.  Genitourinary:  Negative for dysuria and frequency.  Musculoskeletal:  Negative for arthralgias, back pain, joint swelling and neck pain.  Skin:  Negative for rash.  Neurological: Negative.  Negative for tremors and numbness.  Hematological:  Negative for adenopathy. Does not bruise/bleed easily.  Psychiatric/Behavioral:  Negative for behavioral problems (Depression), sleep disturbance and suicidal ideas. The patient is not nervous/anxious.     Vital Signs: BP 120/74   Pulse 80   Temp (!) 96.2 F (35.7 C)   Resp 16   Ht 5' 11 (1.803 m)   Wt 286 lb 3.2 oz (129.8 kg)   SpO2 96%   BMI 39.92 kg/m    Physical Exam Vitals reviewed.  Constitutional:      General: He is not in acute distress.    Appearance: Normal appearance. He is morbidly obese. He is not ill-appearing.  HENT:     Head: Normocephalic and atraumatic.  Eyes:     Pupils: Pupils are equal, round, and reactive to light.  Cardiovascular:     Rate and Rhythm: Normal  rate and regular rhythm.  Pulmonary:     Effort: Pulmonary effort is normal. No respiratory distress.  Neurological:     Mental Status: He is alert and oriented to person, place, and time.  Psychiatric:        Mood and Affect: Mood normal.        Behavior: Behavior normal.        Assessment/Plan: 1. Elevated PSA, less than 10 ng/ml (Primary) I have sent a message to Dr. Twylla notifying him of the elevated PSA so he can get the patient scheduled to come in and be seen  for follow up   2. Type 2 diabetes mellitus with other circulatory complication, with Pung-term current use of insulin  (HCC) A1c is elevated at 7.2. Continue ozempic  and farxiga  and insulin  as prescribed. Repeat A1c in 4 months   3. Hyperlipidemia associated with type 2 diabetes mellitus (HCC) Continue lovastatin  as prescribed.   4. Hypertension associated with diabetes (HCC) Stable, continue lisinopril  and diltiazem  as prescribed.   5. Vitamin D  deficiency Continue vitamin D  supplement   General Counseling: Sebastian verbalizes understanding of the findings of todays visit and agrees with plan of treatment. I have discussed any further diagnostic evaluation that may be needed or ordered today. We also reviewed his medications today. he has been encouraged to call the office with any questions or concerns that should arise related to todays visit.    No orders of the defined types were placed in this encounter.   No orders of the defined types were placed in this encounter.   Return in about 4 months (around 11/14/2024) for F/U, Recheck A1C, Sherriann Szuch PCP.   Total time spent:30 Minutes Time spent includes review of chart, medications, test results, and follow up plan with the patient.   [place] Controlled Substance Database was reviewed by me.  This patient was seen by Mardy Maxin, FNP-C in collaboration with Dr. Sigrid Bathe as a part of collaborative care agreement.   Tyrelle Raczka R. Maxin, MSN,  FNP-C Internal medicine

## 2024-11-20 ENCOUNTER — Ambulatory Visit: Admitting: Nurse Practitioner

## 2025-05-13 ENCOUNTER — Encounter: Admitting: Nurse Practitioner
